# Patient Record
Sex: Female | Born: 1990 | Hispanic: Yes | Marital: Single | Smoking: Never smoker
Health system: Southern US, Community
[De-identification: ages and names within clinical notes are randomized; demographics above are authoritative.]

## PROBLEM LIST (undated history)

## (undated) DIAGNOSIS — R87629 Unspecified abnormal cytological findings in specimens from vagina: Secondary | ICD-10-CM

## (undated) DIAGNOSIS — Z8781 Personal history of (healed) traumatic fracture: Secondary | ICD-10-CM

## (undated) HISTORY — DX: Personal history of (healed) traumatic fracture: Z87.81

## (undated) HISTORY — PX: NO PAST SURGERIES: SHX2092

## (undated) HISTORY — DX: Unspecified abnormal cytological findings in specimens from vagina: R87.629

---

## 2015-08-26 LAB — OB RESULTS CONSOLE ABO/RH: RH TYPE: POSITIVE

## 2015-08-26 LAB — OB RESULTS CONSOLE ANTIBODY SCREEN: ANTIBODY SCREEN: NEGATIVE

## 2015-08-26 LAB — OB RESULTS CONSOLE RUBELLA ANTIBODY, IGM: RUBELLA: IMMUNE

## 2015-08-26 LAB — OB RESULTS CONSOLE GC/CHLAMYDIA
CHLAMYDIA, DNA PROBE: NEGATIVE
GC PROBE AMP, GENITAL: NEGATIVE

## 2015-08-26 LAB — OB RESULTS CONSOLE HIV ANTIBODY (ROUTINE TESTING): HIV: NONREACTIVE

## 2015-08-26 LAB — OB RESULTS CONSOLE HEPATITIS B SURFACE ANTIGEN: Hepatitis B Surface Ag: NEGATIVE

## 2015-08-26 LAB — OB RESULTS CONSOLE RPR: RPR: NONREACTIVE

## 2015-09-09 ENCOUNTER — Encounter: Payer: Self-pay | Admitting: *Deleted

## 2015-09-11 ENCOUNTER — Encounter: Payer: Self-pay | Admitting: Obstetrics & Gynecology

## 2015-09-17 ENCOUNTER — Other Ambulatory Visit (HOSPITAL_COMMUNITY): Payer: Self-pay | Admitting: Nurse Practitioner

## 2015-09-17 DIAGNOSIS — Z8279 Family history of other congenital malformations, deformations and chromosomal abnormalities: Secondary | ICD-10-CM

## 2015-09-20 ENCOUNTER — Ambulatory Visit (INDEPENDENT_AMBULATORY_CARE_PROVIDER_SITE_OTHER): Payer: Medicaid Other | Admitting: Obstetrics and Gynecology

## 2015-09-20 ENCOUNTER — Encounter: Payer: Self-pay | Admitting: Obstetrics and Gynecology

## 2015-09-20 ENCOUNTER — Ambulatory Visit (HOSPITAL_COMMUNITY)
Admission: RE | Admit: 2015-09-20 | Discharge: 2015-09-20 | Disposition: A | Discharge status: Home / Self Care | Payer: Medicaid Other | Source: Ambulatory Visit | Attending: Nurse Practitioner | Admitting: Nurse Practitioner

## 2015-09-20 ENCOUNTER — Encounter (HOSPITAL_COMMUNITY): Payer: Self-pay

## 2015-09-20 ENCOUNTER — Other Ambulatory Visit (HOSPITAL_COMMUNITY): Payer: Self-pay | Admitting: Nurse Practitioner

## 2015-09-20 VITALS — BP 111/70 | HR 67 | Temp 97.7°F | Ht 59.06 in | Wt 160.0 lb

## 2015-09-20 DIAGNOSIS — Z3A30 30 weeks gestation of pregnancy: Secondary | ICD-10-CM

## 2015-09-20 DIAGNOSIS — Z3689 Encounter for other specified antenatal screening: Secondary | ICD-10-CM

## 2015-09-20 DIAGNOSIS — Z36 Encounter for antenatal screening of mother: Secondary | ICD-10-CM | POA: Insufficient documentation

## 2015-09-20 DIAGNOSIS — Z8279 Family history of other congenital malformations, deformations and chromosomal abnormalities: Secondary | ICD-10-CM | POA: Insufficient documentation

## 2015-09-20 DIAGNOSIS — O0933 Supervision of pregnancy with insufficient antenatal care, third trimester: Secondary | ICD-10-CM

## 2015-09-20 DIAGNOSIS — Z315 Encounter for genetic counseling: Secondary | ICD-10-CM | POA: Insufficient documentation

## 2015-09-20 DIAGNOSIS — R87612 Low grade squamous intraepithelial lesion on cytologic smear of cervix (LGSIL): Secondary | ICD-10-CM

## 2015-09-20 LAB — POCT PREGNANCY, URINE: PREG TEST UR: POSITIVE — AB

## 2015-09-20 IMAGING — US US MFM OB DETAIL+14 WK
1 series · 14 of 28 positions shown · non-contrast
Comparison: none

[Series 1: us mfm ob detail+14 wk · 14 of 97 slices shown]
[im 4/97]
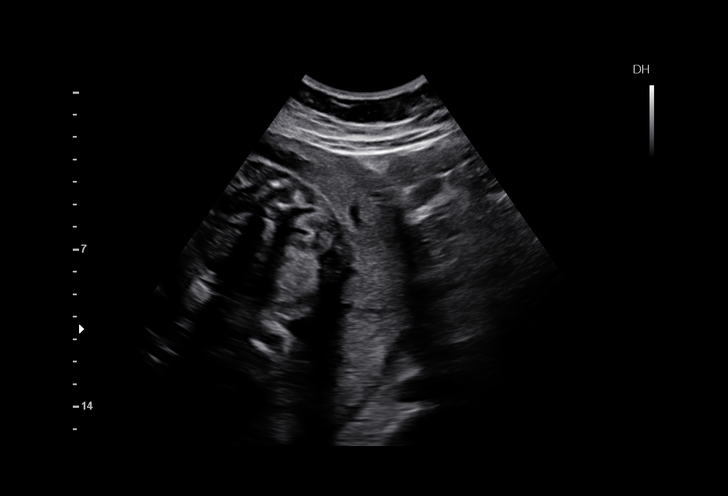
[im 11/97]
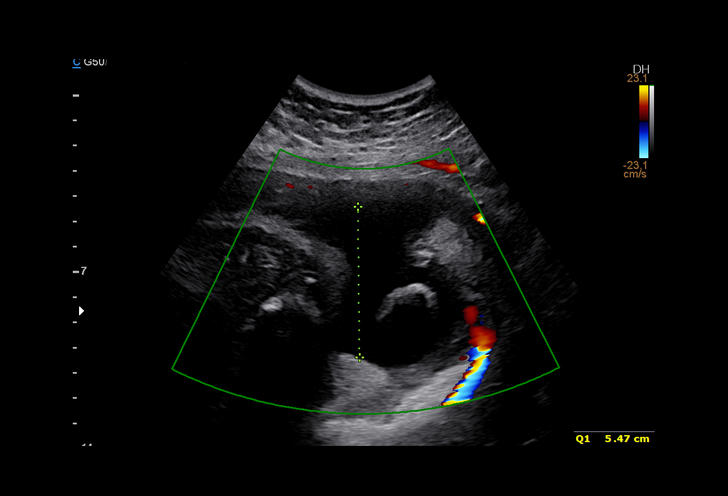
[im 18/97]
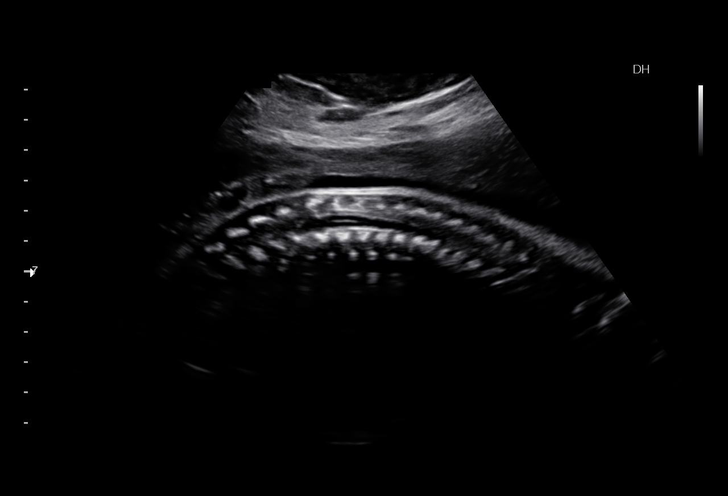
[im 25/97]
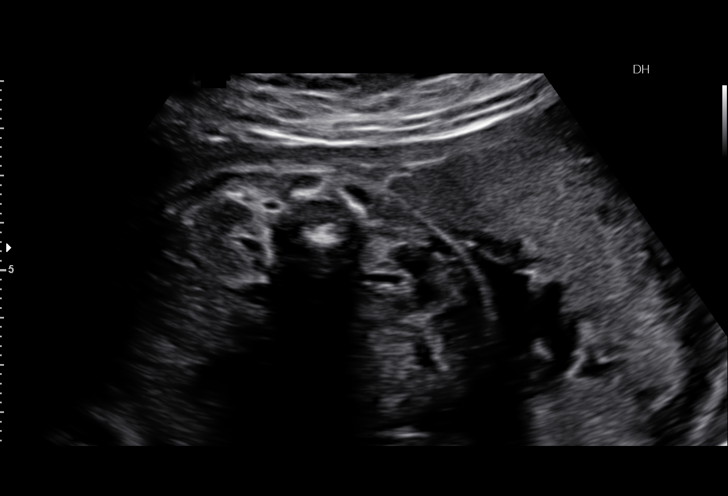
[im 33/97]
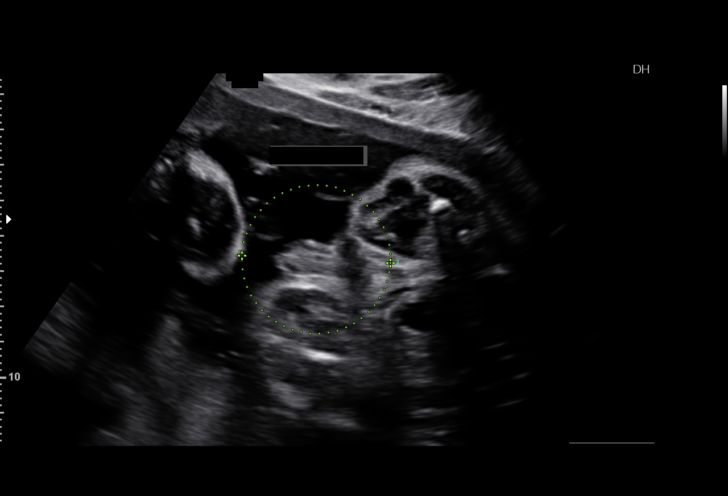
[im 40/97]
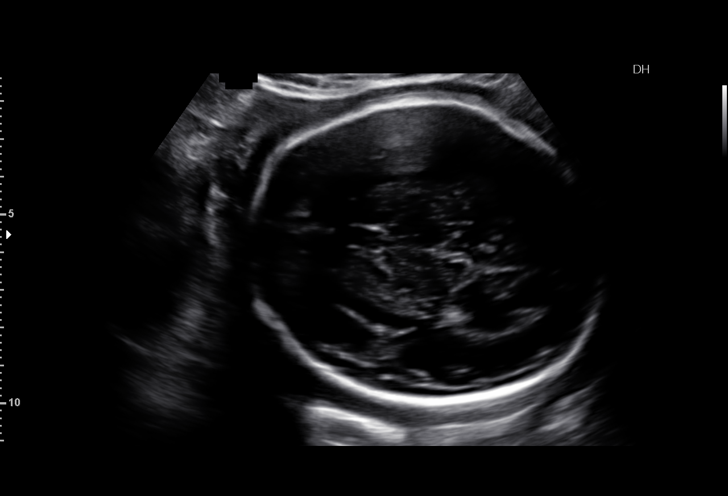
[im 47/97]
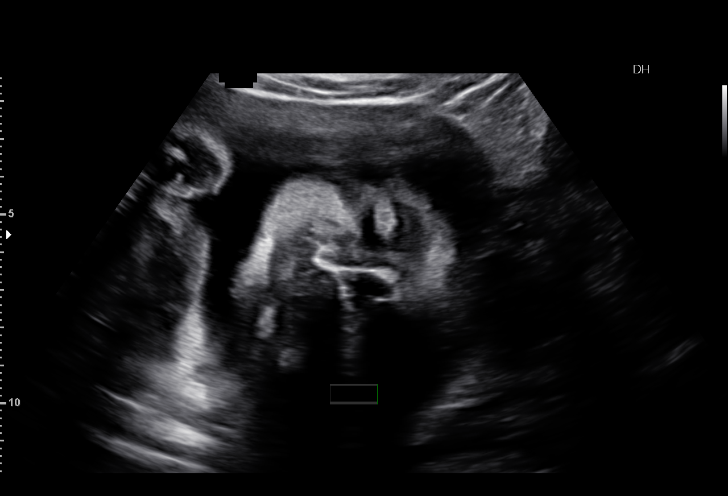
[im 54/97]
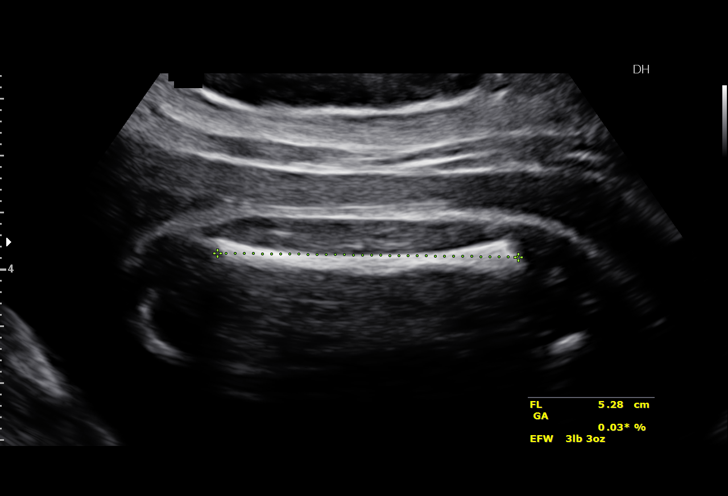
[im 61/97]
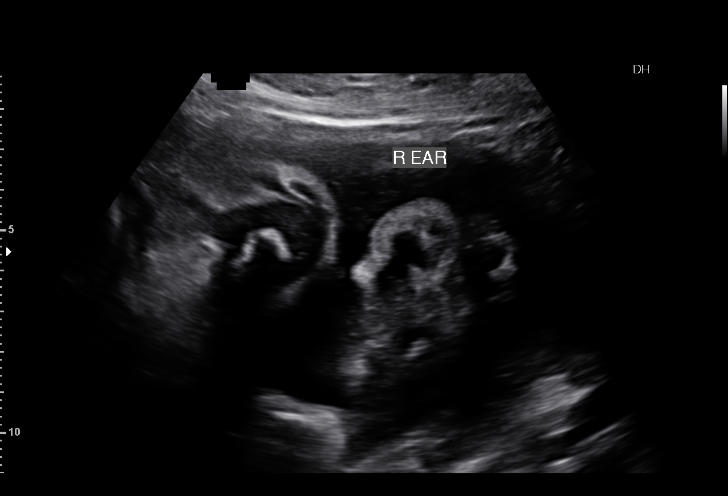
[im 68/97]
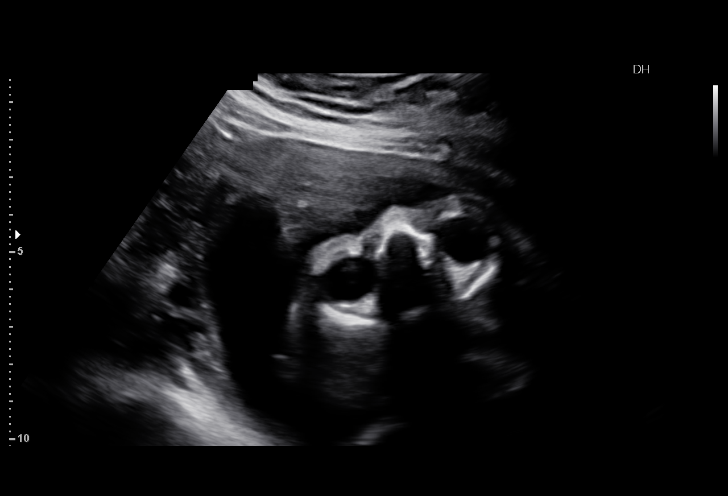
[im 75/97]
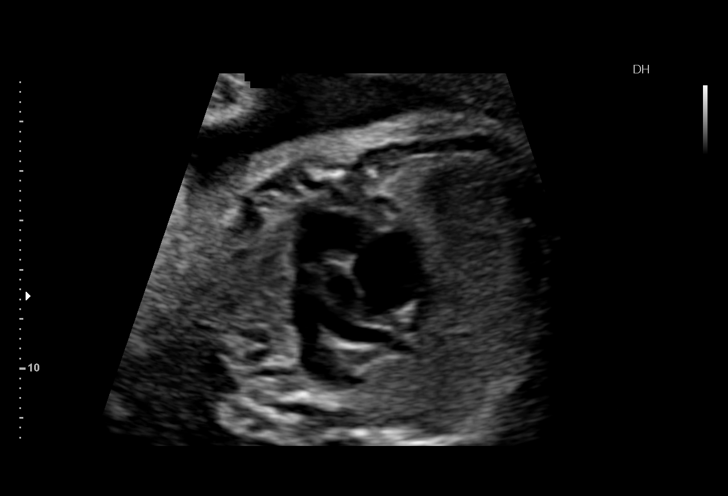
[im 82/97]
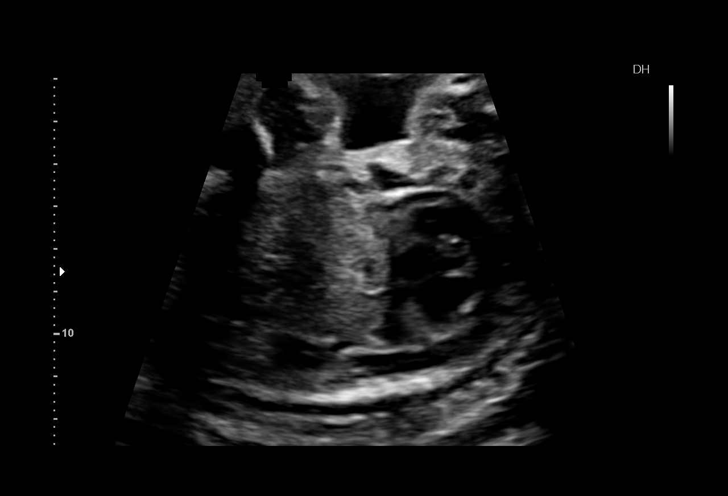
[im 89/97]
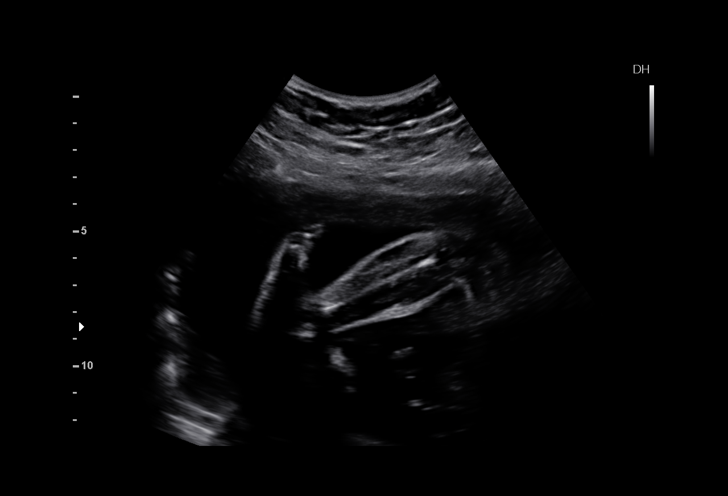
[im 97/97]
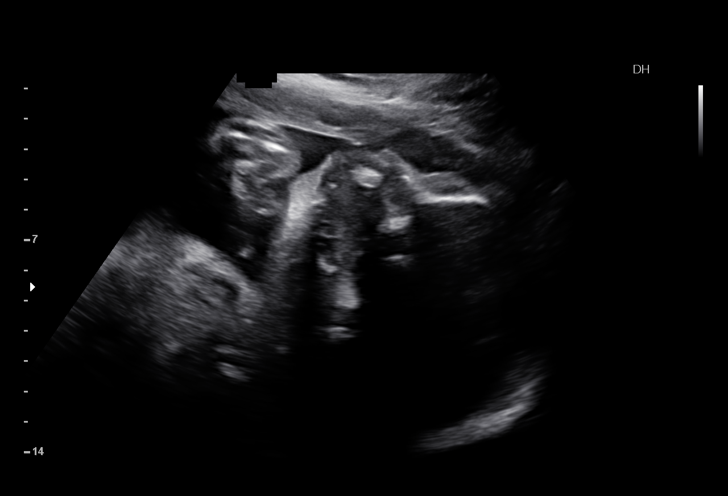

[14 of 28 positions shown; findings below may reference images not displayed]

[REDACTED]-
Faculty Physician

1  NUJ EISSA            [id number]      [id number]     [id number]
Indications

30 weeks gestation of pregnancy
Detailed fetal anatomic survey                 Z36
Family history of congenital anomaly (brother
- Biggs absence of external ear)
Late to prenatal care, third trimester
OB History

Gravidity:    1
Fetal Evaluation

Num Of Fetuses:     1
Fetal Heart         136
Rate(bpm):
Cardiac Activity:   Observed
Presentation:       Cephalic
Placenta:           Posterior Fundal, above cervical os
P. Cord Insertion:  Not well visualized

Amniotic Fluid
AFI FV:      Subjectively within normal limits
AFI Sum:     16.45    cm      60  %Tile      Larg Pckt:   5.96  cm
RUQ:   5.47    cm   RLQ:    3.42   cm    LUQ:   5.96    cm    LLQ:   1.6     cm
Biometry

BPD:      78.1  mm     G. Age:  31w 2d                  CI:         77.91  %    70 - 86
FL/HC:       19.0  %    19.2 -
HC:        280  mm     G. Age:  30w 5d         29  %    HC/AC:       1.07       0.99 -
AC:      262.8  mm     G. Age:  30w 3d         53  %    FL/BPD:      68.2  %    71 - 87
FL:       53.3  mm     G. Age:  28w 2d          4  %    FL/AC:       20.3  %    20 - 24
HUM:      49.6  mm     G. Age:  29w 1d         31  %
CER:      35.7  mm     G. Age:  30w 5d         56  %
CM:          5  mm

Est. FW:    3174   gm     3 lb 4 oz     47  %
Gestational Age

LMP:           32w 1d        Date:  02/07/15                 EDD:    11/14/15
U/S Today:     30w 1d                                        EDD:    11/28/15
Best:          30w 1d     Det. By:  U/S (09/20/15)           EDD:    11/28/15
Anatomy

Cranium:          Appears normal         Aortic Arch:      Appears normal
Fetal Cavum:      Appears normal         Ductal Arch:      Appears normal
Ventricles:       Appears normal         Diaphragm:        Appears normal
Choroid Plexus:   Appears normal         Stomach:          Appears normal, left
sided
Cerebellum:       Appears normal         Abdomen:          Appears normal
Posterior Fossa:  Appears normal         Abdominal Wall:   Appears nml (cord
insert, abd wall)
Nuchal Fold:      Not applicable (>20    Cord Vessels:     Appears normal (3
wks GA)                                  vessel cord)
Face:             Appears normal         Kidneys:          Appear normal
(orbits and profile)
Lips:             Appears normal         Bladder:          Appears normal
Fetal Thoracic:   Appears normal         Spine:            Appears normal
Heart:            Appears normal         Upper             Visualized
(4CH, axis, and        Extremities:
situs)
RVOT:             Appears normal         Lower             Visualized
Extremities:
LVOT:             Appears normal

Other:  Fetus appears to be a male. Heels and 5th digit visualized.
Technically difficult due to advanced GA and fetal position.
Cervix Uterus Adnexa

Cervix
Not visualized (advanced GA >32wks)

Left Ovary
Not visualized.

Right Ovary
Within normal limits.

Adnexa:       No abnormality visualized.
Impression

SIUP at 30+6weeks
Normal detailed fetal anatomy; right ear visualized; left ear
not seen due to fetal position
Normal amniotic fluid volume
Measurements not consistent with LMP dating; EDC: 11/28/15
Recommendations

Follow-up as clinically indicated
Please see genetic counseling note

## 2015-09-20 NOTE — Progress Notes (Signed)
Dori used for interpreter

## 2015-09-20 NOTE — Progress Notes (Signed)
Patient ID: Diane Rubio, female   DOB: 11/06/1990, 24 y.o.   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 Patient is currently pregnant with EDC in 11/2015  Patient given informed consent, signed copy in the chart, time out was performed.  Placed in lithotomy position. Cervix viewed with speculum and colposcope after application of acetic acid.   Colposcopy adequate?  No TZ not visualized Acetowhite lesions?no Punctation?no Mosaicism?  no Abnormal vasculature?  no Biopsies?no ECC?no  COMMENTS: Patient was given post procedure instructions.  She should have repeat colposcopy at her 6 week postpartum visit  [name], MD

## 2015-09-20 NOTE — Progress Notes (Addendum)
Genetic Counseling  High-Risk Gestation Note  Appointment Date:  09/20/2015 Referred By: [name], NP Date of Birth:  12-24-1990   Pregnancy History: G1P0 Estimated Date of Delivery: 11/28/15 Estimated Gestational Age: 18w1dAttending: MRenella Cunas MD   I met with Ms. Diane Rubio for genetic counseling because of a family history of outer ear abnormality. [facility] interpreter, Diane Rubio present for today's visit.   In Summary:   Patient's brother with unilateral microtia, otherwise healthy  Family history most consistent with isolated occurrence of microtia, which would confer low recurrence risk for relatives  Detailed ultrasound performed today; within normal limits  We began by reviewing the family history in detail. The patient reported one full brother with left ear microtia. He is currently 25years old and otherwise healthy, with reportedly normal hearing. He was described to have no additional dysmorphic features, and he reportedly has normal intellect. The patient reported that her mother was on birth control at the beginning of the pregnancy and did not know she was pregnant. The patient's additional eight siblings are healthy.   Incomplete development and growth of the pinna can lead to a small, deformed, or absent (anotia) pinna. Microtia and anotia occur in approximately 1-3 per 10,000 births. The majority of cases are sporadic. However, microtia can be seen due to an underlying genetic syndrome or environmental factor. Underlying genetic syndromes that can have microtia as a feature include Goldenhar syndrome, branchiootorenal syndrome, and mandibulofacial dysostosis. Teratogenic exposures in pregnancy that have been associated with an increased risk for microtia include maternal diabetes, alcohol, isotretinoin, thalidomide, and mycophenolate. We reviewed that external ear malformations have an increased chance of having associated  malformations, such as cardiac defects, renal anomalies or facial clefts. Isolated external ear abnormalities, particularly when unilateral, carry a low recurrence risk. However, rare autosomal dominant and autosomal recessive familial cases have been described.   Given the reported family history of apparently isolated, unilateral external ear congenital malformation for the patient's brother, recurrence risk for the current pregnancy is expected to be low. We discussed that targeted ultrasound would be available in later gestation and may be able to view external ear structures. However, the couple understands that ultrasound cannot diagnose or rule out all birth defects prenatally.   The family histories were otherwise found to be noncontributory for birth defects, intellectual disability, and known genetic conditions. Without further information regarding the provided family history, an accurate genetic risk cannot be calculated. Further genetic counseling is warranted if more information is obtained.   Detailed ultrasound was performed today. Visualized fetal anatomy appeared within normal limits. Complete ultrasound results reported separately.   Ms. Diane Rubio denied exposure to environmental toxins or chemical agents. She denied the use of alcohol, tobacco or street drugs. She denied significant viral illnesses during the course of her pregnancy. Her medical and surgical histories were noncontributory.   I counseled Ms. Diane Rubio regarding the above risks and available options.  The approximate face-to-face time with the genetic counselor was 30 minutes.  Diane Oman MS Certified Genetic Counselor 09/20/2015

## 2015-10-06 NOTE — L&D Delivery Note (Signed)
Delivery Note  Patient presented in spontaneous labor and progressed with pitocin augmentation. Patient pushed for approximately 4.5 hours. She developed signs triple I (fetal tachycardia and maternal fever) and received one dose ampicillin and gentamicin prior to delivery. GBS positive; adequately treated.  At 7:37 PM a viable female was delivered via Vaginal, Spontaneous Delivery (Presentation: Middle Occiput Posterior).  APGAR: 9, 9; weight 6 lb 5.4 oz (2875 g).   Placenta status: Intact, Spontaneous.  Cord: 3 vessels with the following complications: protracted second stage.  Cord pH: not sent  Anesthesia: Epidural  Episiotomy: None Lacerations: 2nd degree;Perineal Suture Repair: 3.0 vicryl Est. Blood Loss (mL):  200  Mom to postpartum.  Baby to Couplet care / Skin to Skin.  Cherrie Gauze Annise Boran 11/23/2015, 8:16 PM

## 2015-10-24 LAB — OB RESULTS CONSOLE GBS: STREP GROUP B AG: POSITIVE

## 2015-10-24 LAB — OB RESULTS CONSOLE GC/CHLAMYDIA
Chlamydia: NEGATIVE
GC PROBE AMP, GENITAL: NEGATIVE

## 2015-11-22 ENCOUNTER — Inpatient Hospital Stay (HOSPITAL_COMMUNITY)
Admission: AD | Admit: 2015-11-22 | Discharge: 2015-11-22 | Disposition: A | Discharge status: Home / Self Care | Payer: Self-pay | Source: Ambulatory Visit | Attending: Obstetrics and Gynecology | Admitting: Obstetrics and Gynecology

## 2015-11-22 ENCOUNTER — Encounter (HOSPITAL_COMMUNITY): Payer: Self-pay | Admitting: *Deleted

## 2015-11-22 ENCOUNTER — Telehealth (HOSPITAL_COMMUNITY): Payer: Self-pay | Admitting: *Deleted

## 2015-11-22 ENCOUNTER — Encounter (HOSPITAL_COMMUNITY): Payer: Self-pay

## 2015-11-22 DIAGNOSIS — Z3A40 40 weeks gestation of pregnancy: Secondary | ICD-10-CM | POA: Insufficient documentation

## 2015-11-22 DIAGNOSIS — O9989 Other specified diseases and conditions complicating pregnancy, childbirth and the puerperium: Secondary | ICD-10-CM | POA: Insufficient documentation

## 2015-11-22 DIAGNOSIS — O26893 Other specified pregnancy related conditions, third trimester: Secondary | ICD-10-CM

## 2015-11-22 DIAGNOSIS — N898 Other specified noninflammatory disorders of vagina: Secondary | ICD-10-CM | POA: Insufficient documentation

## 2015-11-22 LAB — POCT FERN TEST: POCT Fern Test: NEGATIVE

## 2015-11-22 NOTE — MAU Note (Signed)
Pt presents complaining of leaking clear fluid since noon today with some bloody show. Has some cramps every 45 minutes. Reports good fetal movement. Cervix closed on Wednesday.
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
## 2015-11-22 NOTE — Telephone Encounter (Signed)
Preadmission screen [phone] interpreter number

## 2015-11-22 NOTE — Discharge Instructions (Signed)
Tercer trimestre de embarazo (Third Trimester of Pregnancy) El tercer trimestre comprende desde la semana29 hasta la semana42, es decir, desde el mes7 hasta el mes9. El tercer trimestre es un perodo en el que el feto crece rpidamente. Hacia el final del noveno mes, el feto mide alrededor de 20pulgadas (45cm) de largo y pesa entre 6 y 10 libras (2,700 y 4,500kg).  CAMBIOS EN EL ORGANISMO Su organismo atraviesa por muchos cambios durante el embarazo, y estos varan de una mujer a otra.   Seguir aumentando de peso. Es de esperar que aumente entre 25 y 35libras (11 y 16kg) hacia el final del embarazo.  Podrn aparecer las primeras estras en las caderas, el abdomen y las mamas.  Puede tener necesidad de orinar con ms frecuencia porque el feto baja hacia la pelvis y ejerce presin sobre la vejiga.  Debido al embarazo podr sentir acidez estomacal con frecuencia.  Puede estar estreida, ya que ciertas hormonas enlentecen los movimientos de los msculos que empujan los desechos a travs de los intestinos.  Pueden aparecer hemorroides o abultarse e hincharse las venas (venas varicosas).  Puede sentir dolor plvico debido al aumento de peso y a que las hormonas del embarazo relajan las articulaciones entre los huesos de la pelvis. El dolor de espalda puede ser consecuencia de la sobrecarga de los msculos que soportan la postura.  Tal vez haya cambios en el cabello que pueden incluir su engrosamiento, crecimiento rpido y cambios en la textura. Adems, a algunas mujeres se les cae el cabello durante o despus del embarazo, o tienen el cabello seco o fino. Lo ms probable es que el cabello se le normalice despus del nacimiento del beb.  Las mamas seguirn creciendo y le dolern. A veces, puede haber una secrecin amarilla de las mamas llamada calostro.  El ombligo puede salir hacia afuera.  Puede sentir que le falta el aire debido a que se expande el tero.  Puede notar que el feto  "baja" o lo siente ms bajo, en el abdomen.  Puede tener una prdida de secrecin mucosa con sangre. Esto suele ocurrir en el trmino de unos pocos das a una semana antes de que comience el trabajo de parto.  El cuello del tero se vuelve delgado y blando (se borra) cerca de la fecha de parto. QU DEBE ESPERAR EN LOS EXMENES PRENATALES  Le harn exmenes prenatales cada 2semanas hasta la semana36. A partir de ese momento le harn exmenes semanales. Durante una visita prenatal de rutina:  La pesarn para asegurarse de que usted y el feto estn creciendo normalmente.  Le tomarn la presin arterial.  Le medirn el abdomen para controlar el desarrollo del beb.  Se escucharn los latidos cardacos fetales.  Se evaluarn los resultados de los estudios solicitados en visitas anteriores.  Le revisarn el cuello del tero cuando est prxima la fecha de parto para controlar si este se ha borrado. Alrededor de la semana36, el mdico le revisar el cuello del tero. Al mismo tiempo, realizar un anlisis de las secreciones del tejido vaginal. Este examen es para determinar si hay un tipo de bacteria, estreptococo Grupo B. El mdico le explicar esto con ms detalle. El mdico puede preguntarle lo siguiente:  Cmo le gustara que fuera el parto.  Cmo se siente.  Si siente los movimientos del beb.  Si ha tenido sntomas anormales, como prdida de lquido, sangrado, dolores de cabeza intensos o clicos abdominales.  Si est consumiendo algn producto que contenga tabaco, como cigarrillos, tabaco   de mascar y cigarrillos electrnicos.  Si tiene alguna pregunta. Otros exmenes o estudios de deteccin que pueden realizarse durante el tercer trimestre incluyen lo siguiente:  Anlisis de sangre para controlar los niveles de hierro (anemia).  Controles fetales para determinar su salud, nivel de actividad y crecimiento. Si tiene alguna enfermedad o hay problemas durante el embarazo, le harn  estudios.  Prueba del VIH (virus de inmunodeficiencia humana). Si corre un riesgo alto, pueden realizarle una prueba de deteccin del VIH durante el tercer trimestre del embarazo. FALSO TRABAJO DE PARTO Es posible que sienta contracciones leves e irregulares que finalmente desaparecen. Se llaman contracciones de Braxton Hicks o falso trabajo de parto. Las contracciones pueden durar horas, das o incluso semanas, antes de que el verdadero trabajo de parto se inicie. Si las contracciones ocurren a intervalos regulares, se intensifican o se hacen dolorosas, lo mejor es que la revise el mdico.  SIGNOS DE TRABAJO DE PARTO   Clicos de tipo menstrual.  Contracciones cada 5minutos o menos.  Contracciones que comienzan en la parte superior del tero y se extienden hacia abajo, a la zona inferior del abdomen y la espalda.  Sensacin de mayor presin en la pelvis o dolor de espalda.  Una secrecin de mucosidad acuosa o con sangre que sale de la vagina. Si tiene alguno de estos signos antes de la semana37 del embarazo, llame a su mdico de inmediato. Debe concurrir al hospital para que la controlen inmediatamente. INSTRUCCIONES PARA EL CUIDADO EN EL HOGAR   Evite fumar, consumir hierbas, beber alcohol y tomar frmacos que no le hayan recetado. Estas sustancias qumicas afectan la formacin y el desarrollo del beb.  No consuma ningn producto que contenga tabaco, lo que incluye cigarrillos, tabaco de mascar y cigarrillos electrnicos. Si necesita ayuda para dejar de fumar, consulte al mdico. Puede recibir asesoramiento y otro tipo de recursos para dejar de fumar.  Siga las indicaciones del mdico en relacin con el uso de medicamentos. Durante el embarazo, hay medicamentos que son seguros de tomar y otros que no.  Haga ejercicio solamente como se lo haya indicado el mdico. Sentir clicos uterinos es un buen signo para detener la actividad fsica.  Contine comiendo alimentos sanos con  regularidad.  Use un sostn que le brinde buen soporte si le duelen las mamas.  No se d baos de inmersin en agua caliente, baos turcos ni saunas.  Use el cinturn de seguridad en todo momento mientras conduce.  No coma carne cruda ni queso sin cocinar; evite el contacto con las bandejas sanitarias de los gatos y la tierra que estos animales usan. Estos elementos contienen grmenes que pueden causar defectos congnitos en el beb.  Tome las vitaminas prenatales.  Tome entre 1500 y 2000mg de calcio diariamente comenzando en la semana20 del embarazo hasta el parto.  Si est estreida, pruebe un laxante suave (si el mdico lo autoriza). Consuma ms alimentos ricos en fibra, como vegetales y frutas frescos y cereales integrales. Beba gran cantidad de lquido para mantener la orina de tono claro o color amarillo plido.  Dese baos de asiento con agua tibia para aliviar el dolor o las molestias causadas por las hemorroides. Use una crema para las hemorroides si el mdico la autoriza.  Si tiene venas varicosas, use medias de descanso. Eleve los pies durante 15minutos, 3 o 4veces por da. Limite el consumo de sal en su dieta.  Evite levantar objetos pesados, use zapatos de tacones bajos y mantenga una buena postura.  Descanse   con las piernas elevadas si tiene calambres o dolor de cintura.  Visite a su dentista si no lo ha hecho durante el embarazo. Use un cepillo de dientes blando para higienizarse los dientes y psese el hilo dental con suavidad.  Puede seguir manteniendo relaciones sexuales, a menos que el mdico le indique lo contrario.  No haga viajes largos excepto que sea absolutamente necesario y solo con la autorizacin del mdico.  Tome clases prenatales para entender, practicar y hacer preguntas sobre el trabajo de parto y el parto.  Haga un ensayo de la partida al hospital.  Prepare el bolso que llevar al hospital.  Prepare la habitacin del beb.  Concurra a todas  las visitas prenatales segn las indicaciones de su mdico. SOLICITE ATENCIN MDICA SI:  No est segura de que est en trabajo de parto o de que ha roto la bolsa de las aguas.  Tiene mareos.  Siente clicos leves, presin en la pelvis o dolor persistente en el abdomen.  Tiene nuseas, vmitos o diarrea persistentes.  Observa una secrecin vaginal con mal olor.  Siente dolor al orinar. SOLICITE ATENCIN MDICA DE INMEDIATO SI:   Tiene fiebre.  Tiene una prdida de lquido por la vagina.  Tiene sangrado o pequeas prdidas vaginales.  Siente dolor intenso o clicos en el abdomen.  Sube o baja de peso rpidamente.  Tiene dificultad para respirar y siente dolor de pecho.  Sbitamente se le hinchan mucho el rostro, las manos, los tobillos, los pies o las piernas.  No ha sentido los movimientos del beb durante una hora.  Siente un dolor de cabeza intenso que no se alivia con medicamentos.  Su visin se modifica.   Esta informacin no tiene como fin reemplazar el consejo del mdico. Asegrese de hacerle al mdico cualquier pregunta que tenga.   Document Released: 07/01/2005 Document Revised: 10/12/2014 Elsevier Interactive Patient Education 2016 [employer]  

## 2015-11-23 ENCOUNTER — Encounter (HOSPITAL_COMMUNITY): Payer: Self-pay

## 2015-11-23 ENCOUNTER — Inpatient Hospital Stay (HOSPITAL_COMMUNITY): Payer: Medicaid Other | Admitting: Anesthesiology

## 2015-11-23 ENCOUNTER — Inpatient Hospital Stay (HOSPITAL_COMMUNITY)
Admission: AD | Admit: 2015-11-23 | Discharge: 2015-11-25 | DRG: 775 | Disposition: A | Discharge status: Home / Self Care | Payer: Medicaid Other | Source: Ambulatory Visit | Attending: Obstetrics and Gynecology | Admitting: Obstetrics and Gynecology

## 2015-11-23 DIAGNOSIS — O99824 Streptococcus B carrier state complicating childbirth: Secondary | ICD-10-CM | POA: Diagnosis present

## 2015-11-23 DIAGNOSIS — IMO0001 Reserved for inherently not codable concepts without codable children: Secondary | ICD-10-CM

## 2015-11-23 DIAGNOSIS — O411230 Chorioamnionitis, third trimester, not applicable or unspecified: Secondary | ICD-10-CM | POA: Diagnosis present

## 2015-11-23 DIAGNOSIS — Z8279 Family history of other congenital malformations, deformations and chromosomal abnormalities: Secondary | ICD-10-CM

## 2015-11-23 DIAGNOSIS — Z3A40 40 weeks gestation of pregnancy: Secondary | ICD-10-CM

## 2015-11-23 LAB — ABO/RH: ABO/RH(D): O POS

## 2015-11-23 LAB — CBC
HCT: 35.8 % — ABNORMAL LOW (ref 36.0–46.0)
Hemoglobin: 12.5 g/dL (ref 12.0–15.0)
MCH: 29.4 pg (ref 26.0–34.0)
MCHC: 34.9 g/dL (ref 30.0–36.0)
MCV: 84.2 fL (ref 78.0–100.0)
Platelets: 213 10*3/uL (ref 150–400)
RBC: 4.25 MIL/uL (ref 3.87–5.11)
RDW: 14.3 % (ref 11.5–15.5)
WBC: 14.1 10*3/uL — ABNORMAL HIGH (ref 4.0–10.5)

## 2015-11-23 LAB — TYPE AND SCREEN
ABO/RH(D): O POS
Antibody Screen: NEGATIVE

## 2015-11-23 LAB — RPR: RPR: NONREACTIVE

## 2015-11-23 MED ORDER — LANOLIN HYDROUS EX OINT: TOPICAL_OINTMENT | CUTANEOUS | Status: DC | PRN

## 2015-11-23 MED ORDER — LIDOCAINE HCL (PF) 1 % IJ SOLN
30.0000 mL | INTRAMUSCULAR | Status: AC | PRN
  Administered 2015-11-23: 30 mL via SUBCUTANEOUS
  Filled 2015-11-23: qty 30

## 2015-11-23 MED ORDER — DIPHENHYDRAMINE HCL 50 MG/ML IJ SOLN: 12.5000 mg | INTRAMUSCULAR | Status: DC | PRN

## 2015-11-23 MED ORDER — DIBUCAINE 1 % RE OINT: 1.0000 "application " | TOPICAL_OINTMENT | RECTAL | Status: DC | PRN

## 2015-11-23 MED ORDER — PHENYLEPHRINE 40 MCG/ML (10ML) SYRINGE FOR IV PUSH (FOR BLOOD PRESSURE SUPPORT): 80.0000 ug | PREFILLED_SYRINGE | INTRAVENOUS | Status: DC | PRN

## 2015-11-23 MED ORDER — ONDANSETRON HCL 4 MG PO TABS: 4.0000 mg | ORAL_TABLET | ORAL | Status: DC | PRN

## 2015-11-23 MED ORDER — OXYCODONE-ACETAMINOPHEN 5-325 MG PO TABS: 2.0000 | ORAL_TABLET | ORAL | Status: DC | PRN

## 2015-11-23 MED ORDER — BENZOCAINE-MENTHOL 20-0.5 % EX AERO
1.0000 | INHALATION_SPRAY | CUTANEOUS | Status: DC | PRN
Start: 2015-11-23 — End: 2015-11-26
  Administered 2015-11-24: 1 via TOPICAL
  Filled 2015-11-23: qty 56

## 2015-11-23 MED ORDER — ACETAMINOPHEN 325 MG PO TABS
650.0000 mg | ORAL_TABLET | ORAL | Status: DC | PRN
  Administered 2015-11-24: 650 mg via ORAL
  Filled 2015-11-23: qty 2

## 2015-11-23 MED ORDER — TETANUS-DIPHTH-ACELL PERTUSSIS 5-2.5-18.5 LF-MCG/0.5 IM SUSP: 0.5000 mL | Freq: Once | INTRAMUSCULAR | Status: DC

## 2015-11-23 MED ORDER — GENTAMICIN SULFATE 40 MG/ML IJ SOLN
1.5000 mg/kg | Freq: Three times a day (TID) | INTRAVENOUS | Status: DC
  Administered 2015-11-23: 120 mg via INTRAVENOUS
  Filled 2015-11-23 (×2): qty 3

## 2015-11-23 MED ORDER — FENTANYL 2.5 MCG/ML BUPIVACAINE 1/10 % EPIDURAL INFUSION (WH - ANES)
14.0000 mL/h | INTRAMUSCULAR | Status: DC | PRN
  Administered 2015-11-23: 12 mL/h via EPIDURAL

## 2015-11-23 MED ORDER — FLEET ENEMA 7-19 GM/118ML RE ENEM: 1.0000 | ENEMA | RECTAL | Status: DC | PRN

## 2015-11-23 MED ORDER — DIPHENHYDRAMINE HCL 25 MG PO CAPS: 25.0000 mg | ORAL_CAPSULE | Freq: Four times a day (QID) | ORAL | Status: DC | PRN

## 2015-11-23 MED ORDER — SODIUM CHLORIDE 0.9 % IV SOLN: 1.0000 g | Freq: Four times a day (QID) | INTRAVENOUS | Status: DC

## 2015-11-23 MED ORDER — PHENYLEPHRINE 40 MCG/ML (10ML) SYRINGE FOR IV PUSH (FOR BLOOD PRESSURE SUPPORT)
PREFILLED_SYRINGE | INTRAVENOUS | Status: AC
  Filled 2015-11-23: qty 20

## 2015-11-23 MED ORDER — EPHEDRINE 5 MG/ML INJ: 10.0000 mg | INTRAVENOUS | Status: DC | PRN

## 2015-11-23 MED ORDER — LACTATED RINGERS IV SOLN: 500.0000 mL | INTRAVENOUS | Status: DC | PRN

## 2015-11-23 MED ORDER — ONDANSETRON HCL 4 MG/2ML IJ SOLN: 4.0000 mg | INTRAMUSCULAR | Status: DC | PRN

## 2015-11-23 MED ORDER — DEXTROSE 5 % IV SOLN
5.0000 10*6.[IU] | Freq: Once | INTRAVENOUS | Status: AC
  Administered 2015-11-23: 5 10*6.[IU] via INTRAVENOUS
  Filled 2015-11-23: qty 5

## 2015-11-23 MED ORDER — SALINE SPRAY 0.65 % NA SOLN
1.0000 | NASAL | Status: DC | PRN
  Administered 2015-11-23: 1 via NASAL
  Filled 2015-11-23: qty 44

## 2015-11-23 MED ORDER — ACETAMINOPHEN 325 MG PO TABS
650.0000 mg | ORAL_TABLET | ORAL | Status: DC | PRN
  Administered 2015-11-23: 650 mg via ORAL
  Filled 2015-11-23: qty 2

## 2015-11-23 MED ORDER — ZOLPIDEM TARTRATE 5 MG PO TABS: 5.0000 mg | ORAL_TABLET | Freq: Every evening | ORAL | Status: DC | PRN

## 2015-11-23 MED ORDER — PENICILLIN G POTASSIUM 5000000 UNITS IJ SOLR
2.5000 10*6.[IU] | INTRAVENOUS | Status: DC
  Administered 2015-11-23 (×3): 2.5 10*6.[IU] via INTRAVENOUS
  Filled 2015-11-23 (×6): qty 2.5

## 2015-11-23 MED ORDER — FENTANYL 2.5 MCG/ML BUPIVACAINE 1/10 % EPIDURAL INFUSION (WH - ANES)
12.0000 mL/h | INTRAMUSCULAR | Status: DC | PRN
  Filled 2015-11-23: qty 125

## 2015-11-23 MED ORDER — SODIUM CHLORIDE 0.9 % IV SOLN
2.0000 g | Freq: Four times a day (QID) | INTRAVENOUS | Status: DC
  Administered 2015-11-23: 2 g via INTRAVENOUS
  Filled 2015-11-23 (×2): qty 2000

## 2015-11-23 MED ORDER — FENTANYL CITRATE (PF) 100 MCG/2ML IJ SOLN: 50.0000 ug | INTRAMUSCULAR | Status: DC | PRN

## 2015-11-23 MED ORDER — OXYTOCIN BOLUS FROM INFUSION
500.0000 mL | INTRAVENOUS | Status: DC
  Administered 2015-11-23: 500 mL via INTRAVENOUS

## 2015-11-23 MED ORDER — SIMETHICONE 80 MG PO CHEW: 80.0000 mg | CHEWABLE_TABLET | ORAL | Status: DC | PRN

## 2015-11-23 MED ORDER — CITRIC ACID-SODIUM CITRATE 334-500 MG/5ML PO SOLN: 30.0000 mL | ORAL | Status: DC | PRN

## 2015-11-23 MED ORDER — OXYCODONE-ACETAMINOPHEN 5-325 MG PO TABS: 1.0000 | ORAL_TABLET | ORAL | Status: DC | PRN

## 2015-11-23 MED ORDER — ONDANSETRON HCL 4 MG/2ML IJ SOLN: 4.0000 mg | Freq: Four times a day (QID) | INTRAMUSCULAR | Status: DC | PRN

## 2015-11-23 MED ORDER — IBUPROFEN 600 MG PO TABS
600.0000 mg | ORAL_TABLET | Freq: Four times a day (QID) | ORAL | Status: DC
  Administered 2015-11-24 – 2015-11-25 (×8): 600 mg via ORAL
  Filled 2015-11-23 (×8): qty 1

## 2015-11-23 MED ORDER — WITCH HAZEL-GLYCERIN EX PADS
1.0000 | MEDICATED_PAD | CUTANEOUS | Status: DC | PRN
Start: 2015-11-23 — End: 2015-11-26

## 2015-11-23 MED ORDER — PRENATAL MULTIVITAMIN CH
1.0000 | ORAL_TABLET | Freq: Every day | ORAL | Status: DC
  Administered 2015-11-24 – 2015-11-25 (×2): 1 via ORAL
  Filled 2015-11-23 (×2): qty 1

## 2015-11-23 MED ORDER — LACTATED RINGERS IV SOLN
INTRAVENOUS | Status: DC
  Administered 2015-11-23 (×3): via INTRAVENOUS

## 2015-11-23 MED ORDER — MEASLES, MUMPS & RUBELLA VAC ~~LOC~~ INJ: 0.5000 mL | INJECTION | Freq: Once | SUBCUTANEOUS | Status: DC

## 2015-11-23 MED ORDER — SENNOSIDES-DOCUSATE SODIUM 8.6-50 MG PO TABS
2.0000 | ORAL_TABLET | ORAL | Status: DC
  Administered 2015-11-24 – 2015-11-25 (×2): 2 via ORAL
  Filled 2015-11-23 (×2): qty 2

## 2015-11-23 MED ORDER — FENTANYL 2.5 MCG/ML BUPIVACAINE 1/10 % EPIDURAL INFUSION (WH - ANES)
INTRAMUSCULAR | Status: AC
  Filled 2015-11-23: qty 125

## 2015-11-23 MED ORDER — LIDOCAINE HCL (PF) 1 % IJ SOLN
INTRAMUSCULAR | Status: DC | PRN
  Administered 2015-11-23: 4 mL via EPIDURAL
  Administered 2015-11-23: 4 mL

## 2015-11-23 MED ORDER — LACTATED RINGERS IV SOLN: 500.0000 mL | Freq: Once | INTRAVENOUS | Status: DC

## 2015-11-23 MED ORDER — OXYTOCIN 10 UNIT/ML IJ SOLN
2.5000 [IU]/h | INTRAMUSCULAR | Status: DC
  Filled 2015-11-23: qty 4

## 2015-11-23 NOTE — Progress Notes (Signed)
LABOR PROGRESS NOTE  Diane Rubio is a 25 y.o. G1P0 at [redacted]w[redacted]d admitted for SOL.  Subjective: Moderate pain  Objective: BP 121/62 mmHg  Pulse 105  Temp(Src) 102.3 F (39.1 C) (Axillary)  Resp 18  Ht  (1.448 m)  Wt 174 lb (78.926 kg)  BMI 37.64 kg/m2  SpO2 96%  LMP 02/07/2015 or  Filed Vitals:   11/23/15 1632 11/23/15 1701 11/23/15 1731 11/23/15 1800  BP: 120/66 133/85 121/62   Pulse: 101 123 105   Temp:  100.9 F (38.3 C)  102.3 F (39.1 C)  TempSrc:  Axillary  Axillary  Resp: Height:      Weight:      SpO2:        170/mod/-a/-d  Dilation: 10 Dilation Complete Date: 11/23/15 Dilation Complete Time: 1531 Effacement (%): 90 Cervical Position: Middle Station: +2 Presentation: Vertex Exam by:: k fields, rn  Labs: Lab Results  Component Value Date   WBC 14.1* 11/23/2015   HGB 12.5 11/23/2015   HCT 35.8* 11/23/2015   MCV 84.2 11/23/2015   PLT 213 11/23/2015    Patient Active Problem List   Diagnosis Date Noted  . Active labor 11/23/2015  . Low grade squamous intraepithelial lesion (LGSIL) on Papanicolaou smear of cervix 09/20/2015  . Family history of congenital anomaly 09/20/2015    Assessment / Plan: 25 y.o. G1P0 at [redacted]w[redacted]d here for SOL.  Labor: now with 2 hours pushing and no significant change from +1/+2 station. Resting. Will re-start soon. From what I can tell position is OA. Fetal Wellbeing:  Cat 2 - fetal tachycardia Pain Control:  epidural Anticipated MOD:  Unclear Triple I: fever, fetal tachycardia. Unclear when membranes ruptured. Will start amp/gent  Silvano Bilis, MD 11/23/2015, 6:03 PM

## 2015-11-23 NOTE — Anesthesia Preprocedure Evaluation (Signed)
Anesthesia Evaluation  Patient identified by MRN, date of birth, ID band Patient awake    Reviewed: Allergy & Precautions, NPO status , Patient's Chart, lab work & pertinent test results  Airway Mallampati: II  TM Distance: >3 FB Neck ROM: Full    Dental no notable dental hx.    Pulmonary neg pulmonary ROS,    Pulmonary exam normal breath sounds clear to auscultation       Cardiovascular negative cardio ROS Normal cardiovascular exam Rhythm:Regular Rate:Normal     Neuro/Psych negative neurological ROS  negative psych ROS   GI/Hepatic negative GI ROS, Neg liver ROS,   Endo/Other    Renal/GU negative Renal ROS     Musculoskeletal negative musculoskeletal ROS (+)   Abdominal   Peds  Hematology negative hematology ROS (+)   Anesthesia Other Findings   Reproductive/Obstetrics (+) Pregnancy                             Anesthesia Physical Anesthesia Plan  ASA: II  Anesthesia Plan: Epidural   Post-op Pain Management:    Induction:   Airway Management Planned:   Additional Equipment:   Intra-op Plan:   Post-operative Plan:   Informed Consent: I have reviewed the patients History and Physical, chart, labs and discussed the procedure including the risks, benefits and alternatives for the proposed anesthesia with the patient or authorized representative who has indicated his/her understanding and acceptance.     Plan Discussed with:   Anesthesia Plan Comments:         Anesthesia Quick Evaluation

## 2015-11-23 NOTE — MAU Note (Signed)
Pt presents complaining of contractions every 4 minutes. States she can't sit or stand. Denies leaking. Reports bloody show

## 2015-11-23 NOTE — Anesthesia Procedure Notes (Signed)
Epidural Patient location during procedure: OB  Staffing Anesthesiologist: Hagan Vanauken Performed by: anesthesiologist   Preanesthetic Checklist Completed: patient identified, pre-op evaluation, timeout performed, IV checked, risks and benefits discussed and monitors and equipment checked  Epidural Patient position: sitting Prep: site prepped and draped and DuraPrep Patient monitoring: heart rate Approach: midline Location: L3-L4 Injection technique: LOR air and LOR saline  Needle:  Needle type: Tuohy  Needle gauge: 17 G Needle length: 9 cm Needle insertion depth: 6 cm Catheter type: closed end flexible Catheter size: 19 Gauge Test dose: negative  Assessment Sensory level: T8 Events: blood not aspirated, injection not painful, no injection resistance, negative IV test and no paresthesia  Additional Notes Reason for block:procedure for pain   
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
## 2015-11-23 NOTE — Progress Notes (Signed)
Called faculty practice [facility]) to verify that no further antibiotics have been ordered for patient at this time.  Confirmed. Theda Sers, RN

## 2015-11-24 NOTE — Progress Notes (Signed)
Checked on patients needs.  °Spanish Interpreter  °

## 2015-11-24 NOTE — Progress Notes (Signed)
Checked on patient and ordered dinner and breakfast °Spanish Interpreter  °

## 2015-11-24 NOTE — Lactation Note (Signed)
This note was copied from a baby's chart. Lactation Consultation Note Spanish speaking new mom, interpreter at bedside for teaching, asked mom and dad why baby hasn't been BF. Parents state mom has no milk. Hand expression taught demonstrated thick clear colostrum, mom has cone shaped small breast w/puffy areolas and nipple. Lt. Nipple more erect than Rt. Had to work a lot with mom to direct the baby with her hand to latch. Mom expects baby to get onto breast by himself it appears. Multiple times had to direct mom to control baby's head. Gave hand pump to erect nipples prior to latching. Mom has very wide space between breast! Instructed newborn care w/interpreter, I&O, cluster feeding, cluster feeding, supply and demand, breast massage, and positioning. Mom encouraged to feed baby 8-12 times/24 hours and with feeding cues. Encouraged to call for assistance if needed and to verify proper latch.Referred to Baby and Me Book in Breastfeeding section Pg. 22-23 for position options and Proper latch demonstration.Mom encouraged to do skin-to-skin.Referred to Baby and Me Book in Breastfeeding section Pg. 22-23 for position options and Proper latch demonstration.[facility] brochure given w/resources, support groups and LC services. Patient Name: Diane Rubio [id number] Date: 11/24/2015 Reason for consult: Initial assessment   Maternal Data Has patient been taught Hand Expression?: Yes Does the patient have breastfeeding experience prior to this delivery?: No  Feeding Feeding Type: Breast Fed Length of feed: 30 min  LATCH Score/Interventions Latch: Repeated attempts needed to sustain latch, nipple held in mouth throughout feeding, stimulation needed to elicit sucking reflex. Intervention(s): Skin to skin;Teach feeding cues;Waking techniques Intervention(s): Adjust position;Assist with latch;Breast massage;Breast compression  Audible Swallowing: A few with stimulation Intervention(s): Hand  expression;Alternate breast massage  Type of Nipple: Everted at rest and after stimulation (Rt. nipple short shaft )  Comfort (Breast/Nipple): Soft / non-tender     Hold (Positioning): Assistance needed to correctly position infant at breast and maintain latch. Intervention(s): Breastfeeding basics reviewed;Support Pillows;Skin to skin;Position options  LATCH Score: 7  Lactation Tools Discussed/Used Tools: Pump Breast pump type: Manual WIC Program: Yes Pump Review: Setup, frequency, and cleaning;Milk Storage Initiated by:: Peri Jefferson RN Date initiated:: 11/24/15   Consult Status Consult Status: Follow-up Date: 11/25/15 Follow-up type: In-patient    Charyl Dancer 11/24/2015, 4:14 AM

## 2015-11-24 NOTE — Anesthesia Postprocedure Evaluation (Signed)
Anesthesia Post Note  Patient: Diane Rubio  Procedure(s) Performed: * No procedures listed *  Patient location during evaluation: Mother Baby Anesthesia Type: Epidural Level of consciousness: awake and alert Pain management: pain level controlled Vital Signs Assessment: post-procedure vital signs reviewed and stable Respiratory status: spontaneous breathing, nonlabored ventilation and respiratory function stable Cardiovascular status: stable Postop Assessment: no headache, no backache and epidural receding Anesthetic complications: no    Last Vitals:  Filed Vitals:   11/24/15 0500 11/24/15 0544  BP: 99/54 101/55  Pulse: 102 102  Temp: 36.9 C 36.5 C  Resp: 18 18    Last Pain:  Filed Vitals:   11/24/15 0620  PainSc: 0-No pain                 Lorene Klimas

## 2015-11-24 NOTE — Progress Notes (Signed)
Post Partum Day 1 Subjective:  Akeylah Hendel is a 25 y.o. G1P1001 [redacted]w[redacted]d s/p NSVD with Triple I s/p ampl and gent.  No acute events overnight.  Pt denies problems with ambulating, voiding or po intake.  She denies nausea or vomiting.  Pain is well controlled.  She has had flatus.  Lochia Small.  Plan for birth control is IUD.  Method of Feeding: breast and bottle, concerned about minimal breast milk production.    Objective: Blood pressure 101/55, pulse 102, temperature 97.7 F (36.5 C), temperature source Oral, resp. rate 18, height  (1.448 m), weight 78.926 kg (174 lb), last menstrual period 02/07/2015, SpO2 98 %, unknown if currently breastfeeding.  Physical Exam:  General: alert, cooperative and no distress Lochia:normal flow Chest: normal WOB Heart: Regular rate Abdomen: +BS, soft, mild TTP (appropriate) Uterine Fundus: firm,  DVT Evaluation: No evidence of DVT seen on physical exam. Extremities: trace edema   Recent Labs  11/23/15 0515  HGB 12.5  HCT 35.8*    Assessment/Plan:  ASSESSMENT: Nakeia Calvi is a 25 y.o. G1P1001 [redacted]w[redacted]d s/p NSVD with triple I s/p amp and gent.  Plan for discharge tomorrow, Breastfeeding, Lactation consult and Contraception Paraguard Continue routine PP care Breastfeeding support PRN Has been afebrile off antibiotics, will continue to monitor for fevers.  LOS: 1 day   Crystal Dorsey 11/24/2015, 7:59 AM    OB fellow attestation Post Partum Day 1 I have seen and examined this patient and agree with above documentation in the resident's note.   Acquanetta Cabanilla is a 25 y.o. G1P1001 s/p NSVd.  Pt denies problems with ambulating, voiding or po intake. Pain is well controlled.  Plan for birth control is IUD.  Method of Feeding: breast.  No fevers, chills.   PE:  BP 101/55 mmHg  Pulse 102  Temp(Src) 97.7 F (36.5 C) (Oral)  Resp 18  Ht  (1.448 m)  Wt 174 lb (78.926 kg)  BMI 37.64 kg/m2  SpO2 98%  LMP 02/07/2015   Breastfeeding? Unknown Gen: well appearing Heart: reg rate Lungs: normal WOB Fundus firm Ext: soft, no pain, no edema  Plan for discharge: PPD#2 Patient with triple I in labor, s/p amp and gent x1 and now afebrile after delivery.  Routine care  Federico Flake, MD 8:55 AM

## 2015-11-24 NOTE — Progress Notes (Signed)
Assisted RN with interpretation of upcoming tests for baby.  Spanish Interpreter

## 2015-11-24 NOTE — Progress Notes (Signed)
I assisted RN and staff with interpretations until 2000 after delivery.  Spanish Interpreter

## 2015-11-24 NOTE — Progress Notes (Signed)
Assisted RN and staff with interpretations on new unit.  Spanish Interpreter

## 2015-11-24 NOTE — Progress Notes (Signed)
Interpreter used for initial assessment, reassessment, and for breastfeeding education. Theda Sers, RN

## 2015-11-25 ENCOUNTER — Encounter (HOSPITAL_COMMUNITY): Payer: Self-pay | Admitting: *Deleted

## 2015-11-25 ENCOUNTER — Ambulatory Visit: Payer: Self-pay

## 2015-11-25 MED ORDER — ACETAMINOPHEN 325 MG PO TABS: 650.0000 mg | ORAL_TABLET | ORAL | Status: AC | PRN

## 2015-11-25 MED ORDER — IBUPROFEN 600 MG PO TABS: 600.0000 mg | ORAL_TABLET | Freq: Four times a day (QID) | ORAL | Status: AC

## 2015-11-25 NOTE — Lactation Note (Addendum)
This note was copied from a baby's chart. Lactation Consultation Note  Patient Name: Diane Rubio [id number] Date: 11/25/2015 Reason for consult: Follow-up assessment Interpreter used. Baby at 50 hr of life and has only had 1 BM in lifetime. Suggested ways that mom could supplement with her milk. She declined, asked for formula. She chose Aluminium given by syring. Demonstrated how to use the syring to FOB, he was able to replicate. Encouraged mom to offer the breast 8+/24hr, manually express after bf, and offer her milk back. If she does not want to do that she should offer formula per guidelines after latching until the baby has a BM. She is aware of OP services and support group.    Maternal Data    Feeding Feeding Type: Breast Fed Length of feed: 15 min  LATCH Score/Interventions                      Lactation Tools Discussed/Used     Consult Status Consult Status: Follow-up Date: 11/26/15 Follow-up type: In-patient    Diane Rubio 11/25/2015, 9:54 PM

## 2015-11-25 NOTE — Discharge Summary (Signed)
Obstetric Discharge Summary Reason for Admission: onset of labor Prenatal Procedures: none Intrapartum Procedures: spontaneous vaginal delivery and GBS prophylaxis Postpartum Procedures: antibiotics Complications-Operative and Postpartum: 2nd degree perineal laceration   Delivery Note At 7:37 PM a viable female was delivered via Vaginal, Spontaneous Delivery (Presentation: Middle Occiput Posterior).  APGAR: 9, 9; weight 6 lb 5.4 oz (2875 g).   Placenta status: Intact, Spontaneous.  Cord: 3 vessels with the following complications: None.  Cord pH:  Anesthesia: Epidural  Episiotomy: None Lacerations: 2nd degree;Perineal Suture Repair: 3.0 vicryl Est. Blood Loss (mL): 200  Mom to postpartum.  Baby to Couplet care / Skin to Skin.  Demetrios Loll 11/25/2015, 7:26 AM     H/H: Lab Results  Component Value Date/Time   HGB 12.5 11/23/2015 05:15 AM   HCT 35.8* 11/23/2015 05:15 AM     Discharge Diagnoses: Term Pregnancy-delivered and Amnionitis   ASSESSMENT: Kimbery Harwood is a 25 y.o. G1P1001 [redacted]w[redacted]d s/p NSVD with triple I s/p amp and gent. She is doing well and had no acute events overnight. She is ambulating well. She denies any N/V. She has had a BM and is urinating. She complains of lower back pain that is normal for her. Informed patient that she will continue to have back pain for a few weeks. Control pain with tylenol.   Outpatient circumcision, Contraception Paraguard Continue routine PP care Breastfeeding support PRN Has been afebrile off antibiotics  Discharge Information: Date: 04/16/2011 Activity: unrestricted Diet: routine Medications: Tylenol #3 Breast feeding: Breast and bottle feeding Condition: stable Instructions: refer to practice specific booklet Discharge to: home   Demetrios Loll 11/25/2015,7:26 AM

## 2015-11-25 NOTE — Discharge Summary (Signed)
OB Discharge Summary  Patient Name: Diane Rubio [facility] DOB: 1991/05/06 MRN: [id number]  Date of admission: 11/23/2015 Delivering MD: Shonna Chock [place]   Date of discharge: 11/25/2015  Admitting diagnosis: 40 WKS CTX EVERY 4 MIN CAN NOT SIT  AND HARD TO STAND Intrauterine pregnancy: [redacted]w[redacted]d     Secondary diagnosis:Active Problems:   Active labor  Additional problems:none     Discharge diagnosis: Term Pregnancy Delivered                                                                     Post partum procedures:none  Augmentation: none  Complications: None  Hospital course:  Onset of Labor With Vaginal Delivery     25 y.o. yo G1P1001 at [redacted]w[redacted]d was admitted in Active Labor on 11/23/2015. Patient had an uncomplicated labor course as follows:  Membrane Rupture Time/Date:   ,    Intrapartum Procedures: Episiotomy: None [1]                                         Lacerations:  2nd degree [3];Perineal [11]  Patient had a delivery of a Viable infant. 11/23/2015  Information for the patient's newborn:  Jinna, Weinman [[id number]]  Delivery Method: Vaginal, Spontaneous Delivery (Filed from Delivery Summary)    Pateint had an uncomplicated postpartum course.  She is ambulating, tolerating a regular diet, passing flatus, and urinating well. Patient is discharged home in stable condition on 11/25/2015.    Physical exam  Filed Vitals:   11/24/15 0544 11/24/15 1300 11/24/15 1824 11/25/15 0555  BP: 101/55 99/52 107/55 96/61  Pulse: 102 93 78 75  Temp: 97.7 F (36.5 C) 98.5 F (36.9 C) 98.6 F (37 C) 97.2 F (36.2 C)  TempSrc: Oral Oral Oral   Resp: Height:      Weight:      SpO2:   100%    General: alert, cooperative and no distress Lochia: appropriate Uterine Fundus: firm Incision: N/A DVT Evaluation: No evidence of DVT seen on physical exam. Negative Homan's sign. No cords or calf tenderness. Labs: Lab Results  Component Value Date   WBC 14.1*  11/23/2015   HGB 12.5 11/23/2015   HCT 35.8* 11/23/2015   MCV 84.2 11/23/2015   PLT 213 11/23/2015   No flowsheet data found.  Discharge instruction: per After Visit Summary and "Baby and Me Booklet".  After Visit Meds:    Medication List    ASK your doctor about these medications        multivitamin-prenatal 27-0.8 MG Tabs tablet  Take 1 tablet by mouth daily at 12 noon.        Diet: routine diet  Activity: Advance as tolerated. Pelvic rest for 6 weeks.   Outpatient follow up:6 weeks Follow up Appt:Future Appointments Date Time Provider Department Center  11/27/2015 12:00 AM [facility] ROOM [facility] None   Follow up visit: No Follow-up on file.  Postpartum contraception: IUD Paragard  Newborn Data: Live born female  Birth Weight: 6 lb 5.4 oz (2875 g) APGAR: 9, 9  Baby Feeding: Breast Disposition:home with mother  11/25/2015 Ferdie Ping, CNM

## 2015-11-25 NOTE — Lactation Note (Signed)
This note was copied from a baby's chart. Lactation Consultation Note  Patient Name: Diane Rubio [id number] Date: 11/25/2015 Reason for consult: Follow-up assessment  Called "Diane Rubio" in-house Spanish Interpreter, she stated that she was busy and to call [place]. Attempted to use "Diane Rubio" the in-house mobile interpreter, but received an "error" message that not able to access Wifi. Used [employer] "Diane Rubio" [phone].  Baby 38 hours old. Mom reports that she is having breast soreness. Mom has been using cradle position, so attempted cradle position initially, but not able to latch baby deeply. Assisted mom to latch baby in football position and demonstrated how to flange baby's lower lip to both parents. Mom reported increased comfort and no more pain. Enc mom to support baby's head while nursing. Baby suckled rhythmically and maintained a deep latch with intermittent swallows. Discussed ways of knowing that baby is getting enough at breast. Mom has small, wide-spaced breast that are now easily compressible and expressible. Mom has clear drops of colostrum. Enc mom to have baby at breast often. Discussed normal progression of milk coming to volume and referred parents to the Baby and Me booklet for the number of diapers to expect by day of life, EBM storage guidelines, and reasons to call the pediatrician.  Parents aware of OP/BFSG and LC phone line assistance after D/C. Maternal Data    Feeding Feeding Type: Breast Fed (LC assessed first 15 minutes of BF.) Length of feed: 17 min  LATCH Score/Interventions Latch: Grasps breast easily, tongue down, lips flanged, rhythmical sucking. Intervention(s): Skin to skin;Waking techniques Intervention(s): Adjust position;Assist with latch;Breast compression  Audible Swallowing: Spontaneous and intermittent  Type of Nipple: Everted at rest and after stimulation  Comfort (Breast/Nipple): Filling, red/small blisters or bruises,  mild/mod discomfort  Problem noted: Mild/Moderate discomfort  Hold (Positioning): Assistance needed to correctly position infant at breast and maintain latch. Intervention(s): Breastfeeding basics reviewed;Support Pillows;Position options;Skin to skin  LATCH Score: 8  Lactation Tools Discussed/Used     Consult Status Consult Status: PRN    Geralynn Ochs 11/25/2015, 10:19 AM

## 2015-11-25 NOTE — Discharge Instructions (Signed)
Amniocentesis, cuidados posteriores (Amniocentesis, Care After) Estas indicaciones le proporcionan informacin acerca de cmo deber cuidarse despus del procedimiento. El mdico tambin podr darle instrucciones especficas. Comunquese con el mdico si tiene algn problema o tiene preguntas despus del procedimiento. QU ESPERAR DESPUS DEL PROCEDIMIENTO Despus del procedimiento, puede tener clicos abdominales. CUIDADOS EN EL HOGAR  Haga reposo durante las primeras 24 horas.  No levante objetos pesados durante el resto del da.  No realice ninguna [employer] demande mucha energa durante el resto del da.  Siga las indicaciones de su mdico respecto de lo siguiente:  Cundo puede regresar a Printmaker.  Cundo puede [employer].  Cundo puede realizar [employer].  Cundo puede reanudar sus actividades habituales.  No tenga relaciones sexuales hasta que el mdico lo autorice.  [employer] medicamentos de venta libre y los recetados solamente como se lo haya indicado el mdico.  Beba suficiente lquido para Pharmacologist el pis (orina) claro o de color amarillo plido. Esto ayuda con los clicos abdominales.  Retire los [employer] de la prueba. Pregntele al mdico la fecha en que estarn disponibles. SOLICITE AYUDA SI:  Siente dolor de estmago (abdominal). SOLICITE AYUDA DE INMEDIATO SI:  Siente un dolor muy intenso en el vientre.  Tiene clicos abdominales muy intensos.  Le sale sangre de la vagina o del vientre.  Le sale lquido de la vagina o del vientre.  Tiene fiebre o cree que tiene [place].  No siente al beb moverse tanto como lo hace normalmente.   Esta informacin no tiene Theme park manager el consejo del mdico. Asegrese de hacerle al mdico cualquier pregunta que tenga.   Document Released: 01/06/2011 Document Revised: 06/12/2015 Elsevier Interactive Patient Education 2016 [employer]. Parto vaginal, Cuidados posteriores    (Vaginal Delivery, Care After) Siga estas instrucciones durante las prximas semanas. Estas indicaciones para el alta le proporcionan informacin general acerca de cmo deber cuidarse despus del parto. El mdico tambin podr darle instrucciones especficas. El tratamiento ha sido planificado segn las prcticas mdicas actuales, pero en algunos casos pueden ocurrir problemas. Comunquese con el mdico si tiene algn problema o tiene preguntas al volver a su casa.  INSTRUCCIONES PARA EL CUIDADO EN EL HOGAR   Tome slo medicamentos de venta libre o recetados, segn las indicaciones del mdico o del Social research officer, government.  No beba alcohol, especialmente si est amamantando o toma analgsicos.  No mastique tabaco ni fume.  No consuma drogas.  Contine con un adecuado cuidado perineal. El buen cuidado perineal incluye:  Higienizarse de adelante hacia atrs.  Mantener la zona perineal limpia.  No use tampones ni duchas vaginales hasta que su mdico la autorice.  Dchese, lvese el cabello y tome baos de inmersin segn las indicaciones de su mdico.  Utilice un sostn que le ajuste bien y que brinde buen soporte a sus Building control surveyor.  Consuma alimentos saludables.  Beba suficiente lquido para Photographer orina clara o de color amarillo plido.  Consuma alimentos ricos en fibra como cereales y panes integrales, arroz, frijoles y frutas y verduras frescas todos los Yeoman. Estos alimentos pueden ayudarla a prevenir o Educational psychologist.  Siga las recomendaciones de su mdico relacionadas con la reanudacin de actividades como subir escaleras, conducir automviles, levantar objetos, hacer ejercicios o viajar.  Hable con su mdico acerca de reanudar la actividad sexual. Volver a la actividad sexual depende del riesgo de infeccin, la velocidad de la curacin y la comodidad y su deseo de Chartered loss adjuster.  Trate de que alguien la  ayude con las actividades del hogar y con el recin nacido al menos durante  un par de das despus de salir del hospital.  Descanse todo lo que pueda. Trate de descansar o tomar una siesta mientras el beb est durmiendo.  Aumente sus actividades gradualmente.  Cumpla con todas las visitas de control programadas para despus del parto. Es muy importante asistir a todas las Merchant navy officer de seguimiento. En estas citas, su mdico va a controlarla para asegurarse de que est sanando fsica y emocionalmente. SOLICITE ATENCIN MDICA SI:   Elimina cogulos grandes por la vagina. Guarde algunos cogulos para mostrarle al mdico.  Tiene una secrecin con feo olor que proviene de la vagina.  Tiene dificultad para orinar.  Orina con frecuencia.  Siente dolor al [employer].  Nota un cambio en sus movimientos intestinales.  Aumenta el enrojecimiento, el dolor o la hinchazn en la zona de la incisin vaginal (episiotoma) o el desgarro vaginal.  Tiene pus que drena por la episiotoma o el desgarro vaginal.  La episiotoma o el desgarro vaginal se abren.  Sus [employer] duelen, estn duras o enrojecidas.  Sufre un dolor intenso de [place].  Tiene visin borrosa o ve manchas.  Se siente triste o deprimida.  Tiene pensamientos acerca de lastimarse o daar al recin nacido.  Tiene preguntas acerca de su cuidado personal, el cuidado del recin nacido o acerca de los medicamentos.  Se siente mareada o sufre un desmayo.  Tiene una erupcin.  Tiene nuseas o vmitos.  Usted amamant al beb y no ha tenido su perodo menstrual dentro de las 12 semanas despus de dejar de Museum/gallery exhibitions officer.  No amamanta al beb y no tuvo su perodo menstrual en las ltimas 12 semanas despus del parto.  Tiene fiebre. SOLICITE ATENCIN MDICA DE INMEDIATO SI:   Siente dolor persistente.  Siente dolor en el pecho.  Le falta el aire.  Se desmaya.  Siente dolor en la pierna.  Siente Physiological scientist.  El sangrado vaginal satura dos o ms apsitos en 1 hora.   Esta  informacin no tiene Theme park manager el consejo del mdico. Asegrese de hacerle al mdico cualquier pregunta que tenga.   Document Released: 09/21/2005 Document Revised: 06/12/2015 Elsevier Interactive Patient Education [employer]

## 2015-11-26 ENCOUNTER — Ambulatory Visit: Payer: Self-pay

## 2015-11-26 NOTE — Lactation Note (Signed)
This note was copied from a baby's chart. Lactation Consultation Note  Patient Name: Boy Jonnelle Lawniczak RUEAV'W Date: 11/26/2015 Reason for consult: Follow-up assessment;Other (Comment);Hyperbilirubinemia (no stool at 61 hours.)   Used in-house interpreter "Eda." Baby has still not had a stool. Mom has just been nursing for past 45 minutes and her right nipple is pinched. Discussed with mom the need to support baby's head while at breast in order to obtain and maintain a deep latch. Mom states that she is not using the football hold--which does work better for her nipple pain--because her back is hurting her. Enc mom to use pillows to keep her back comfortable while nursing in football position. Also discussed lying on her side to nurse, and the laid back position. Enc mom to support baby's head at breast in whichever position she uses. Mom's breasts are starting to fill, and baby has softened mom's right breast. Enc mom to not let baby hang out at breast, but to nurse and then supplement. Discussed hand expression in order to supplement with her own EBM now that her milk is coming to volume. Mom given a hand pump and bottles for collection as well, and enc to ask for DEBP setup if desired.  Reviewed supplementation guidelines and enc increasing the amounts given in increments of 5 ml. FOB gave baby 25 ml of formula after baby at breast with syringe and finger and baby tolerated well, suckling eagerly throughout the feeding. Enc parents to nurse with cues and at least every 3 hours. Baby passing gas while being supplemented. Baby staying for phototherapy. Discussed assessment and interventions with patient's bedside nurse, Morrie Sheldon, RN.  Maternal Data    Feeding Feeding Type: Formula Length of feed: 45 min  LATCH Score/Interventions Latch: Repeated attempts needed to sustain latch, nipple held in mouth throughout feeding, stimulation needed to elicit sucking reflex. Intervention(s): Teach  feeding cues;Waking techniques Intervention(s): Adjust position;Assist with latch;Breast compression  Audible Swallowing: A few with stimulation Intervention(s): Hand expression Intervention(s): Hand expression  Type of Nipple: Everted at rest and after stimulation  Comfort (Breast/Nipple): Soft / non-tender     Hold (Positioning): Assistance needed to correctly position infant at breast and maintain latch. Intervention(s): Position options;Breastfeeding basics reviewed;Skin to skin  LATCH Score: 7  Lactation Tools Discussed/Used     Consult Status Consult Status: Follow-up Date: 11/27/15 Follow-up type: In-patient    Geralynn Ochs 11/26/2015, 9:12 AM

## 2015-11-27 ENCOUNTER — Inpatient Hospital Stay (HOSPITAL_COMMUNITY): Admission: RE | Admit: 2015-11-27 | Payer: Self-pay | Source: Ambulatory Visit

## 2015-11-27 ENCOUNTER — Ambulatory Visit: Payer: Self-pay

## 2015-11-27 NOTE — Lactation Note (Signed)
This note was copied from a baby's chart. Lactation Consultation Note  Patient Name: Diane Rubio JYNWG'[id number] Date: 11/27/2015 Reason for consult: Follow-up assessment;Breast/nipple pain Eda not available per phone. Used Interpreter [phone] for visit. Mom had baby to breast when LC arrived. Baby demonstrating some nutritive suckles. When baby came off the right breast, Mom attempted to re-latch to left breast but baby was fussy so she gave baby 45 ml of EBM she had pumped. Breasts soft to palpation but LC notes positional stripe on both nipples. No bleeding with nursing. Advised to apply EBM to sore nipples, Mom reports discomfort with nursing but reports it is improving. Comfort gels given with instructions. Assisted Mom with latch to obtain more depth, Mom reported less discomfort after assist. Encouraged Mom to BF with each feeding both breasts 15-20 minutes at least before giving any bottle. Post pump to protect milk supply and to have EBM to supplement if needed. Advised Mom if breasts are filling between feeding but soften when baby nurses and baby satisfied at the breast then stop or limit pumping/supplementing and BF her baby. If baby not satisfied after BF, then pump/supplement. Engorgement care reviewed if needed. Advised of OP services and support group. Encouraged to call for questions/concerns.   Maternal Data    Feeding Feeding Type: Breast Fed Length of feed: 10 min  LATCH Score/Interventions Latch: Grasps breast easily, tongue down, lips flanged, rhythmical sucking. Intervention(s): Teach feeding cues;Waking techniques Intervention(s): Adjust position;Assist with latch;Breast massage;Breast compression  Audible Swallowing: A few with stimulation Intervention(s): Hand expression Intervention(s): Hand expression  Type of Nipple: Everted at rest and after stimulation  Comfort (Breast/Nipple): Engorged, cracked, bleeding, large blisters, severe discomfort  Problem noted:  Cracked, bleeding, blisters, bruises;Mild/Moderate discomfort Interventions  (Cracked/bleeding/bruising/blister): Expressed breast milk to nipple Interventions (Mild/moderate discomfort): Comfort gels  Hold (Positioning): Assistance needed to correctly position infant at breast and maintain latch. Intervention(s): Breastfeeding basics reviewed;Support Pillows;Position options;Skin to skin  LATCH Score: 6  Lactation Tools Discussed/Used Tools: Pump;Comfort gels Breast pump type: Double-Electric Breast Pump WIC Program: Yes   Consult Status Consult Status: Complete Date: 11/27/15 Follow-up type: In-patient    Alfred Levins 11/27/2015, 11:53 AM

## 2016-04-08 ENCOUNTER — Telehealth (HOSPITAL_COMMUNITY): Payer: Self-pay | Admitting: *Deleted

## 2016-04-08 NOTE — Telephone Encounter (Signed)
Telephoned patient at home # and left message to return call to [facility] 

## 2016-04-21 ENCOUNTER — Telehealth (HOSPITAL_COMMUNITY): Payer: Self-pay | Admitting: *Deleted

## 2016-04-21 NOTE — Telephone Encounter (Signed)
Telephoned patient at home # and left message to return call to [facility] 

## 2016-05-19 ENCOUNTER — Encounter (HOSPITAL_COMMUNITY): Payer: Self-pay | Admitting: *Deleted

## 2016-05-19 ENCOUNTER — Telehealth (HOSPITAL_COMMUNITY): Payer: Self-pay | Admitting: *Deleted

## 2016-05-19 NOTE — Telephone Encounter (Signed)
Telephoned patient at home # and left message to return call to [facility]. Used interpreter Julie Sowell. 

## 2016-06-11 ENCOUNTER — Ambulatory Visit (HOSPITAL_COMMUNITY): Payer: Self-pay

## 2016-06-18 ENCOUNTER — Encounter: Payer: Self-pay | Admitting: Family Medicine

## 2017-04-09 ENCOUNTER — Emergency Department (HOSPITAL_COMMUNITY)
Admission: EM | Admit: 2017-04-09 | Discharge: 2017-04-10 | Disposition: A | Discharge status: Home / Self Care | Payer: No Typology Code available for payment source | Attending: Emergency Medicine | Admitting: Emergency Medicine

## 2017-04-09 ENCOUNTER — Encounter (HOSPITAL_COMMUNITY): Payer: Self-pay

## 2017-04-09 ENCOUNTER — Emergency Department (HOSPITAL_COMMUNITY): Payer: No Typology Code available for payment source

## 2017-04-09 DIAGNOSIS — R109 Unspecified abdominal pain: Secondary | ICD-10-CM | POA: Insufficient documentation

## 2017-04-09 DIAGNOSIS — Y9389 Activity, other specified: Secondary | ICD-10-CM | POA: Insufficient documentation

## 2017-04-09 DIAGNOSIS — M791 Myalgia: Secondary | ICD-10-CM | POA: Diagnosis not present

## 2017-04-09 DIAGNOSIS — S0990XA Unspecified injury of head, initial encounter: Secondary | ICD-10-CM | POA: Diagnosis present

## 2017-04-09 DIAGNOSIS — N949 Unspecified condition associated with female genital organs and menstrual cycle: Secondary | ICD-10-CM

## 2017-04-09 DIAGNOSIS — Z791 Long term (current) use of non-steroidal anti-inflammatories (NSAID): Secondary | ICD-10-CM | POA: Diagnosis not present

## 2017-04-09 DIAGNOSIS — N83201 Unspecified ovarian cyst, right side: Secondary | ICD-10-CM | POA: Insufficient documentation

## 2017-04-09 DIAGNOSIS — Y998 Other external cause status: Secondary | ICD-10-CM | POA: Insufficient documentation

## 2017-04-09 DIAGNOSIS — N83202 Unspecified ovarian cyst, left side: Secondary | ICD-10-CM | POA: Insufficient documentation

## 2017-04-09 DIAGNOSIS — Y92410 Unspecified street and highway as the place of occurrence of the external cause: Secondary | ICD-10-CM | POA: Diagnosis not present

## 2017-04-09 DIAGNOSIS — R079 Chest pain, unspecified: Secondary | ICD-10-CM | POA: Diagnosis not present

## 2017-04-09 DIAGNOSIS — M545 Low back pain: Secondary | ICD-10-CM | POA: Diagnosis not present

## 2017-04-09 DIAGNOSIS — M7918 Myalgia, other site: Secondary | ICD-10-CM

## 2017-04-09 LAB — CBC WITH DIFFERENTIAL/PLATELET
BASOS ABS: 0 10*3/uL (ref 0.0–0.1)
BASOS PCT: 0 %
EOS ABS: 0.1 10*3/uL (ref 0.0–0.7)
Eosinophils Relative: 1 %
HEMATOCRIT: 37.4 % (ref 36.0–46.0)
HEMOGLOBIN: 13.1 g/dL (ref 12.0–15.0)
Lymphocytes Relative: 17 %
Lymphs Abs: 1.8 10*3/uL (ref 0.7–4.0)
MCH: 29.6 pg (ref 26.0–34.0)
MCHC: 35 g/dL (ref 30.0–36.0)
MCV: 84.4 fL (ref 78.0–100.0)
Monocytes Absolute: 0.7 10*3/uL (ref 0.1–1.0)
Monocytes Relative: 7 %
NEUTROS ABS: 7.9 10*3/uL — AB (ref 1.7–7.7)
NEUTROS PCT: 75 %
Platelets: 284 10*3/uL (ref 150–400)
RBC: 4.43 MIL/uL (ref 3.87–5.11)
RDW: 13.8 % (ref 11.5–15.5)
WBC: 10.5 10*3/uL (ref 4.0–10.5)

## 2017-04-09 LAB — COMPREHENSIVE METABOLIC PANEL
ALK PHOS: 82 U/L (ref 38–126)
ALT: 41 U/L (ref 14–54)
ANION GAP: 6 (ref 5–15)
AST: 30 U/L (ref 15–41)
Albumin: 4.2 g/dL (ref 3.5–5.0)
BILIRUBIN TOTAL: 0.4 mg/dL (ref 0.3–1.2)
BUN: 14 mg/dL (ref 6–20)
CALCIUM: 8.5 mg/dL — AB (ref 8.9–10.3)
CO2: 23 mmol/L (ref 22–32)
CREATININE: 0.57 mg/dL (ref 0.44–1.00)
Chloride: 109 mmol/L (ref 101–111)
Glucose, Bld: 92 mg/dL (ref 65–99)
Potassium: 3.7 mmol/L (ref 3.5–5.1)
Sodium: 138 mmol/L (ref 135–145)
TOTAL PROTEIN: 7.7 g/dL (ref 6.5–8.1)

## 2017-04-09 LAB — POC URINE PREG, ED: Preg Test, Ur: NEGATIVE
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
## 2017-04-09 MED ORDER — IOPAMIDOL (ISOVUE-300) INJECTION 61%
INTRAVENOUS | Status: AC
  Filled 2017-04-09: qty 100

## 2017-04-09 MED ORDER — IOPAMIDOL (ISOVUE-300) INJECTION 61%
INTRAVENOUS | Status: AC
  Filled 2017-04-09: qty 30

## 2017-04-09 MED ORDER — IOPAMIDOL (ISOVUE-300) INJECTION 61%
100.0000 mL | Freq: Once | INTRAVENOUS | Status: AC | PRN
  Administered 2017-04-09: 100 mL via INTRAVENOUS

## 2017-04-09 MED ORDER — MORPHINE SULFATE (PF) 2 MG/ML IV SOLN
4.0000 mg | Freq: Once | INTRAVENOUS | Status: AC
  Administered 2017-04-09: 4 mg via INTRAVENOUS
  Filled 2017-04-09: qty 2

## 2017-04-09 NOTE — ED Notes (Signed)
Patient transported to X-ray 
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
## 2017-04-09 NOTE — ED Notes (Signed)
Pt made aware urine is needed 

## 2017-04-09 NOTE — ED Notes (Addendum)
Pt is c/o chest tenderness mid back pain on palpation and right quad pain with palpation. Pt does report having tingling in bilateral LE and reports decrease sensation and strength to RLE.
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
## 2017-04-09 NOTE — ED Notes (Signed)
Below order not completed by [facility]. 

## 2017-04-10 MED ORDER — ONDANSETRON 4 MG PO TBDP
4.0000 mg | ORAL_TABLET | Freq: Once | ORAL | Status: AC
  Administered 2017-04-10: 4 mg via ORAL
  Filled 2017-04-10: qty 1
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
## 2018-11-01 ENCOUNTER — Other Ambulatory Visit (HOSPITAL_COMMUNITY): Payer: Self-pay | Admitting: *Deleted

## 2018-11-01 DIAGNOSIS — N644 Mastodynia: Secondary | ICD-10-CM

## 2018-11-15 ENCOUNTER — Encounter: Payer: Self-pay | Admitting: Pediatric Intensive Care

## 2018-12-06 ENCOUNTER — Encounter (HOSPITAL_COMMUNITY): Payer: Self-pay

## 2018-12-06 ENCOUNTER — Ambulatory Visit
Admission: RE | Admit: 2018-12-06 | Discharge: 2018-12-06 | Disposition: A | Discharge status: Home / Self Care | Payer: No Typology Code available for payment source | Source: Ambulatory Visit | Attending: Obstetrics and Gynecology | Admitting: Obstetrics and Gynecology

## 2018-12-06 ENCOUNTER — Other Ambulatory Visit (HOSPITAL_COMMUNITY): Payer: Self-pay | Admitting: Obstetrics and Gynecology

## 2018-12-06 ENCOUNTER — Ambulatory Visit (HOSPITAL_COMMUNITY)
Admission: RE | Admit: 2018-12-06 | Discharge: 2018-12-06 | Disposition: A | Discharge status: Home / Self Care | Payer: Self-pay | Source: Ambulatory Visit | Attending: Obstetrics and Gynecology | Admitting: Obstetrics and Gynecology

## 2018-12-06 VITALS — BP 104/72 | Wt 148.0 lb

## 2018-12-06 DIAGNOSIS — N644 Mastodynia: Secondary | ICD-10-CM

## 2018-12-06 DIAGNOSIS — Z1239 Encounter for other screening for malignant neoplasm of breast: Secondary | ICD-10-CM

## 2018-12-06 IMAGING — MG DIGITAL DIAGNOSTIC BILATERAL MAMMOGRAM WITH TOMO AND CAD
8 series · 8 of 24 positions shown · non-contrast
Comparison: None.

CLINICAL DATA: 27-year-old female presenting for evaluation of pain
in the lower outer to lateral aspect of the right breast as well as
the retroareolar right breast. She has family history of breast
cancer in her sister who was diagnosed at age 35.

EXAM:
DIGITAL DIAGNOSTIC BILATERAL MAMMOGRAM WITH CAD AND TOMO
ULTRASOUND RIGHT BREAST

[R MLO synth-2D]
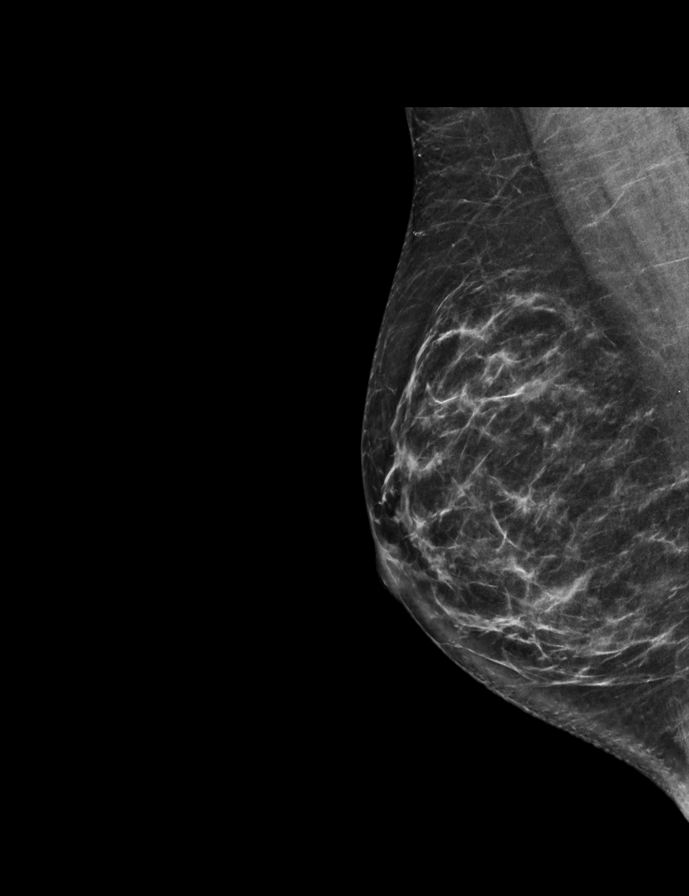

[L MLO synth-2D]
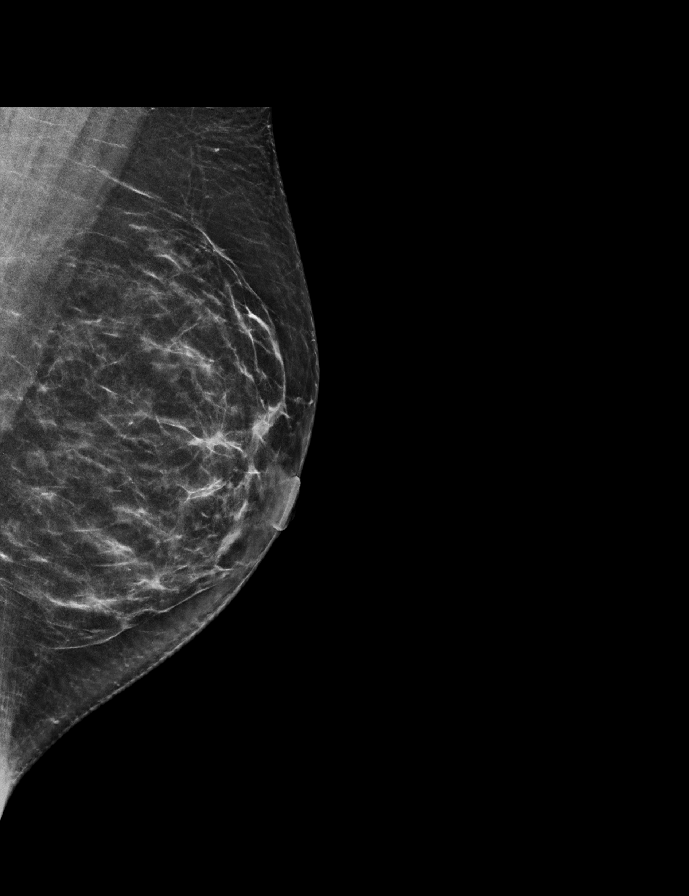

[L CC synth-2D]
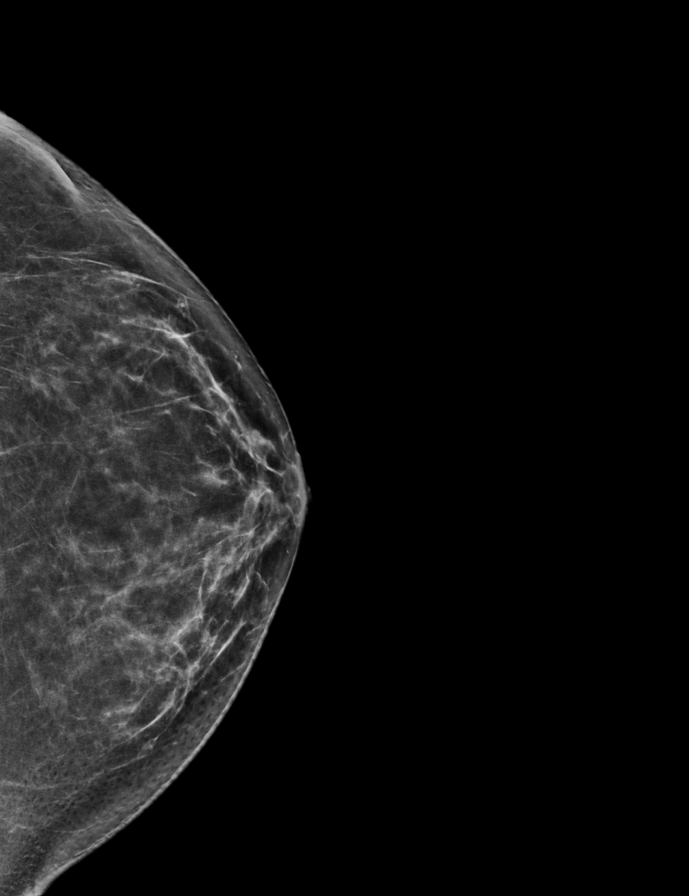

[R CC synth-2D]
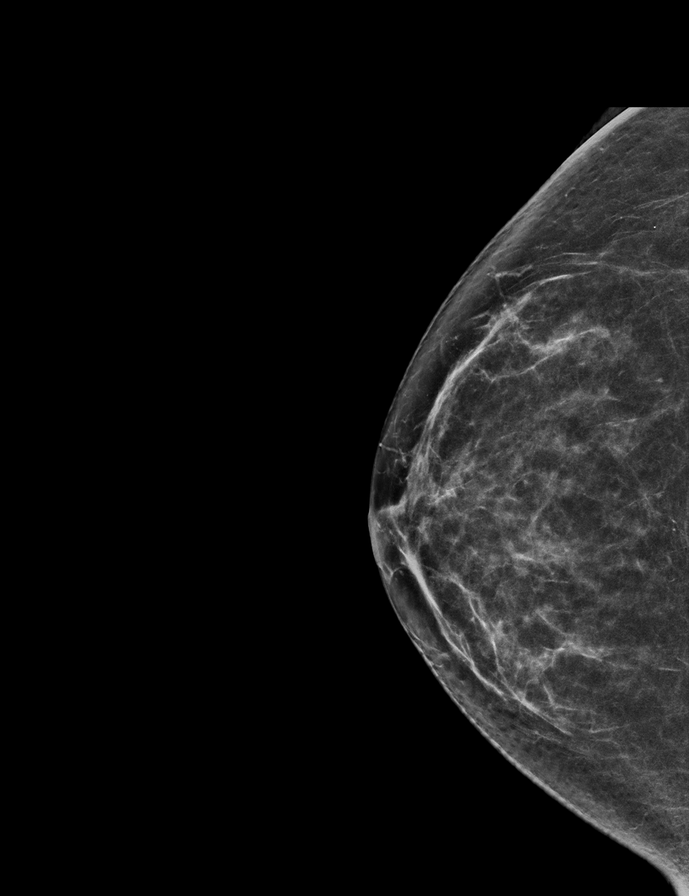

[R CC tomo · tomo slice 33/66.0]
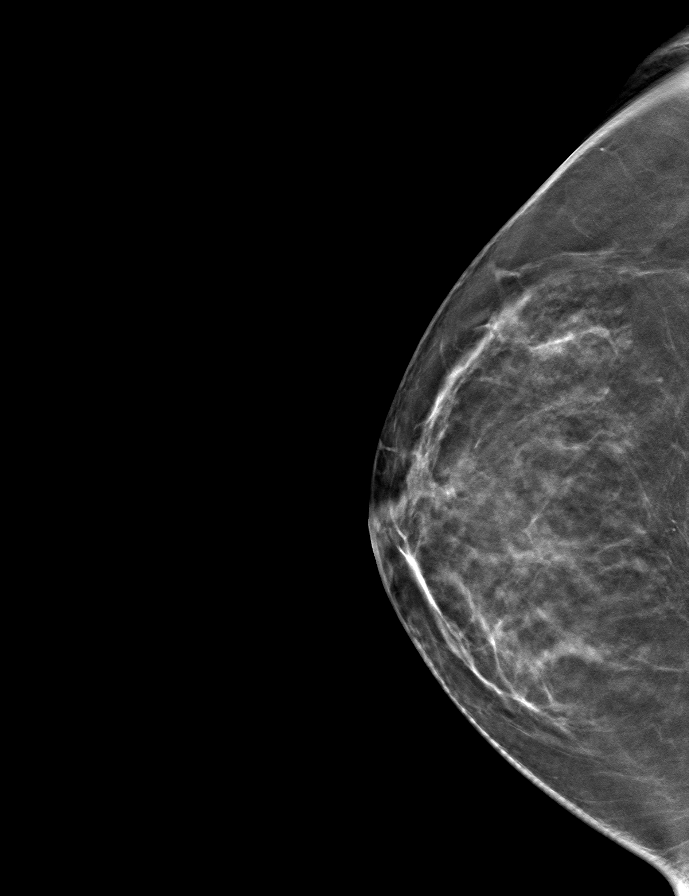

[L CC tomo · tomo slice 33/65.0]
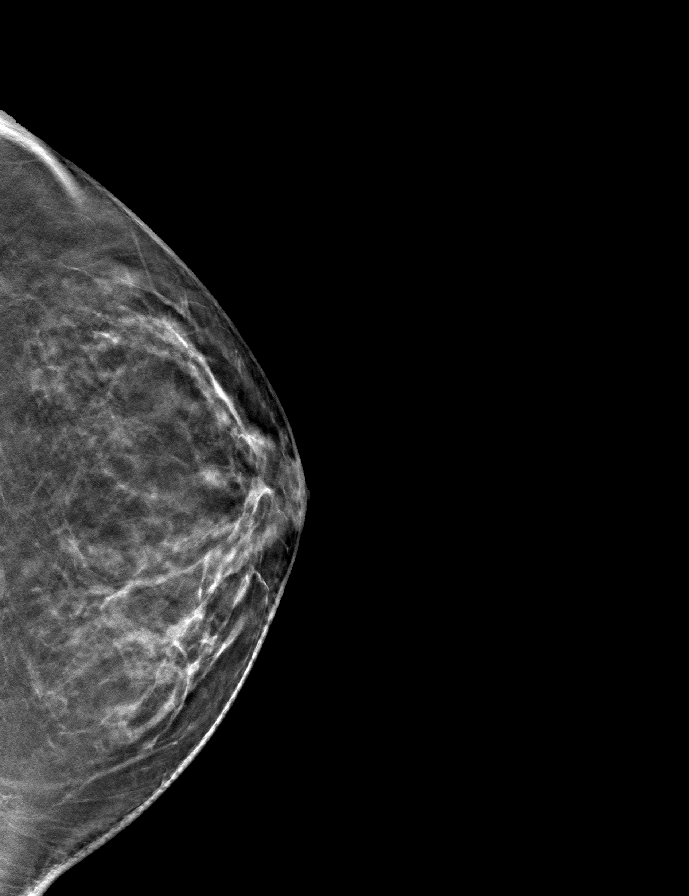

[R MLO tomo · tomo slice 35/68.0]
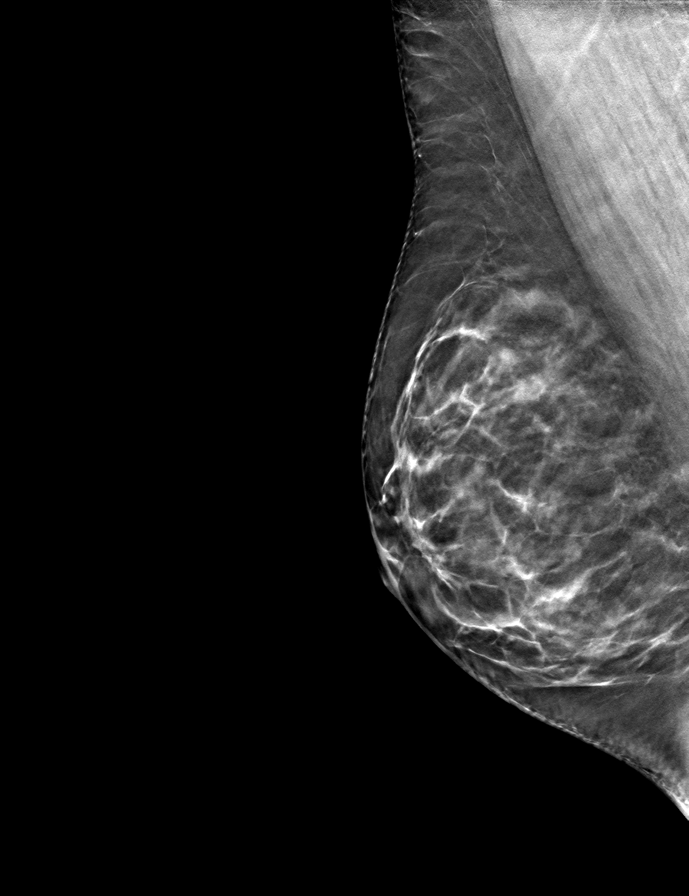

[L MLO tomo · tomo slice 35/69.0]
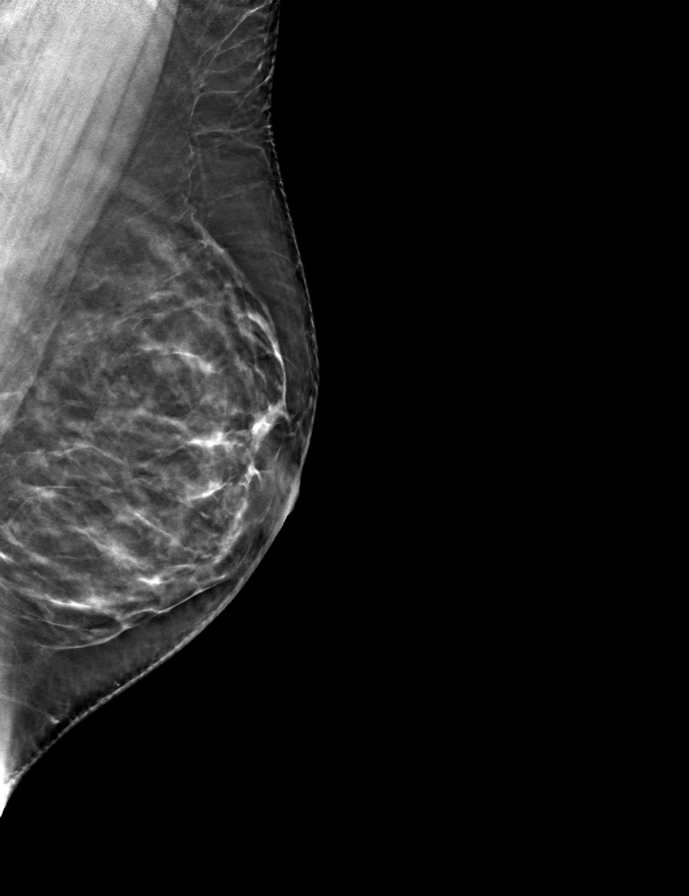

[8 of 24 positions shown; findings below may reference images not displayed]

ACR Breast Density Category c: The breast tissue is heterogeneously
dense, which may obscure small masses.
FINDINGS: No suspicious calcifications, masses or areas of distortion are seen
in the bilateral breasts.

Mammographic images were processed with CAD.

Ultrasound of the lower outer quadrant of the right breast
demonstrates normal fibroglandular tissue. No masses or suspicious
areas of shadowing are identified. The retroareolar right breast was
also imaged demonstrating normal tissue.
IMPRESSION: 1. There are no mammographic or targeted sonographic abnormalities
in the right breast to explain the patient's nonfocal right breast
pain.

2.  No mammographic evidence of malignancy in the bilateral breasts.

RECOMMENDATION:
1. Clinical follow-up recommended for the painful area of concern in
the lower outer right breast. Any further workup should be based on
clinical grounds.

2. Given the young age at diagnosis of her sister, recommend
screening mammogram in one year.(Code:[T6])

I have discussed the findings and recommendations with the patient.
Results were also provided in writing at the conclusion of the
visit. If applicable, a reminder letter will be sent to the patient
regarding the next appointment.

BI-RADS CATEGORY  1: Negative.

## 2018-12-06 NOTE — Progress Notes (Signed)
Complaints of diffuse right breast pain x 3 months that comes and goes. Patient rates the pain at a 10 out of 10.  Pap Smear: Pap smear not completed today. Last Pap smear was 09/09/2018 at [facility] and ASCUS with negative HPV. Patient has a history of two abnormal Pap smears 12/01/2015 that was ASCUS with positive HPV and 08/29/2015 that was LGSIL with cells suspicious of HGSIL that no follow-up was completed. Last three Pap smear results are in Epic.  Physical exam: Breasts Breasts symmetrical. No skin abnormalities bilateral breasts. No nipple retraction bilateral breasts. No nipple discharge bilateral breasts. No lymphadenopathy. No lumps palpated bilateral breasts. No complaints of pain or tenderness on exam. Referred patient to the [facility] for a right breast ultrasound. Appointment scheduled for Tuesday, December 06, 2018 at 1100.        Pelvic/Bimanual No Pap smear completed today since last Pap smear and HPV typing was 09/09/2018. Pap smear not indicated per BCCCP guidelines.   Smoking History: Patient has never smoked.  Patient Navigation: Patient education provided. Access to services provided for patient through [address] [facility] program. Spanish interpreter provided.   Breast and Cervical Cacer Risk Assessment: Patient has a family history of her sister having breast cancer. Patient has no known genetic mutations or history of radiation treatment to the chest before age 56. Patient has a history of cervical dysplasia. Patient has no history of being immunocompromised or DES exposure in-utero. Breast cancer risk assessment completed. No breast cancer risk calculated due to patient is less than 24 years old.  Used Spanish interpreter [employer] from [place].

## 2018-12-06 NOTE — Patient Instructions (Signed)
Explained breast self awareness with Alisa Graff Puac. Patient did not need a Pap smear today due to last Pap smear was 09/09/2018. Let patient know that her next Pap smear is due in December 2020 due to her last Pap smear was abnormal. Referred patient to the [facility] for a right breast ultrasound. Appointment scheduled for Tuesday, December 06, 2018 at 1100. Patient aware of appointment and will be there. Alisa Graff Puac verbalized understanding.  Worley Radermacher, Kathaleen Maser, RN 9:03 AM
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
## 2018-12-21 ENCOUNTER — Encounter (HOSPITAL_COMMUNITY): Payer: Self-pay | Admitting: *Deleted

## 2019-01-04 ENCOUNTER — Ambulatory Visit: Payer: Self-pay | Admitting: Internal Medicine

## 2019-02-28 ENCOUNTER — Ambulatory Visit: Payer: Self-pay | Admitting: Internal Medicine

## 2021-02-18 NOTE — Progress Notes (Signed)
Patient was not available on the phone when CMA transferred call to provider. While provider on the phone the interpreter, Gean Maidens ID#: [id number], attempted to call the patient again twice without success.

## 2021-02-19 ENCOUNTER — Encounter: Payer: Self-pay | Admitting: Family

## 2021-02-19 ENCOUNTER — Other Ambulatory Visit: Payer: Self-pay

## 2021-02-19 DIAGNOSIS — Z7689 Persons encountering health services in other specified circumstances: Secondary | ICD-10-CM

## 2021-02-19 DIAGNOSIS — Z789 Other specified health status: Secondary | ICD-10-CM

## 2021-02-19 NOTE — Progress Notes (Signed)
Establish care Needs physical exam
